# Patient Record
Sex: Male | Born: 1940 | Race: White | Hispanic: No | Marital: Married | Smoking: Never smoker
Health system: Southern US, Community
[De-identification: ages and names within clinical notes are randomized; demographics above are authoritative.]

## PROBLEM LIST (undated history)

## (undated) DIAGNOSIS — E119 Type 2 diabetes mellitus without complications: Secondary | ICD-10-CM

## (undated) DIAGNOSIS — G4733 Obstructive sleep apnea (adult) (pediatric): Secondary | ICD-10-CM

## (undated) DIAGNOSIS — M1990 Unspecified osteoarthritis, unspecified site: Secondary | ICD-10-CM

## (undated) DIAGNOSIS — N529 Male erectile dysfunction, unspecified: Secondary | ICD-10-CM

## (undated) DIAGNOSIS — K4090 Unilateral inguinal hernia, without obstruction or gangrene, not specified as recurrent: Secondary | ICD-10-CM

## (undated) DIAGNOSIS — I480 Paroxysmal atrial fibrillation: Secondary | ICD-10-CM

## (undated) DIAGNOSIS — I499 Cardiac arrhythmia, unspecified: Secondary | ICD-10-CM

## (undated) DIAGNOSIS — Z8489 Family history of other specified conditions: Secondary | ICD-10-CM

## (undated) DIAGNOSIS — Z87442 Personal history of urinary calculi: Secondary | ICD-10-CM

## (undated) DIAGNOSIS — R600 Localized edema: Secondary | ICD-10-CM

## (undated) DIAGNOSIS — F32A Depression, unspecified: Secondary | ICD-10-CM

## (undated) DIAGNOSIS — G4730 Sleep apnea, unspecified: Secondary | ICD-10-CM

## (undated) DIAGNOSIS — E785 Hyperlipidemia, unspecified: Secondary | ICD-10-CM

## (undated) DIAGNOSIS — K429 Umbilical hernia without obstruction or gangrene: Secondary | ICD-10-CM

## (undated) DIAGNOSIS — I5189 Other ill-defined heart diseases: Secondary | ICD-10-CM

## (undated) DIAGNOSIS — J45909 Unspecified asthma, uncomplicated: Secondary | ICD-10-CM

## (undated) DIAGNOSIS — I2510 Atherosclerotic heart disease of native coronary artery without angina pectoris: Secondary | ICD-10-CM

## (undated) DIAGNOSIS — N183 Chronic kidney disease, stage 3 unspecified: Secondary | ICD-10-CM

## (undated) DIAGNOSIS — N400 Enlarged prostate without lower urinary tract symptoms: Secondary | ICD-10-CM

## (undated) DIAGNOSIS — F329 Major depressive disorder, single episode, unspecified: Secondary | ICD-10-CM

## (undated) DIAGNOSIS — K219 Gastro-esophageal reflux disease without esophagitis: Secondary | ICD-10-CM

## (undated) DIAGNOSIS — M5416 Radiculopathy, lumbar region: Secondary | ICD-10-CM

## (undated) DIAGNOSIS — I493 Ventricular premature depolarization: Secondary | ICD-10-CM

## (undated) DIAGNOSIS — I519 Heart disease, unspecified: Secondary | ICD-10-CM

## (undated) DIAGNOSIS — I10 Essential (primary) hypertension: Secondary | ICD-10-CM

## (undated) DIAGNOSIS — R413 Other amnesia: Secondary | ICD-10-CM

## (undated) DIAGNOSIS — I4891 Unspecified atrial fibrillation: Secondary | ICD-10-CM

## (undated) DIAGNOSIS — I700 Atherosclerosis of aorta: Secondary | ICD-10-CM

## (undated) HISTORY — PX: CHOLECYSTECTOMY: SHX55

## (undated) HISTORY — DX: Heart disease, unspecified: I51.9

## (undated) HISTORY — PX: CYST REMOVAL LEG: SHX6280

## (undated) HISTORY — DX: Depression, unspecified: F32.A

## (undated) HISTORY — DX: Unspecified osteoarthritis, unspecified site: M19.90

## (undated) HISTORY — PX: BACK SURGERY: SHX140

## (undated) HISTORY — PX: HERNIA REPAIR: SHX51

## (undated) HISTORY — DX: Essential (primary) hypertension: I10

## (undated) HISTORY — DX: Unspecified atrial fibrillation: I48.91

## (undated) HISTORY — DX: Sleep apnea, unspecified: G47.30

## (undated) HISTORY — PX: CYSTOSCOPY W/ URETEROSCOPY W/ LITHOTRIPSY: SUR380

## (undated) HISTORY — DX: Type 2 diabetes mellitus without complications: E11.9

## (undated) HISTORY — DX: Major depressive disorder, single episode, unspecified: F32.9

## (undated) HISTORY — PX: KIDNEY STONE SURGERY: SHX686

## (undated) HISTORY — PX: PROSTATE BIOPSY: SHX241

## (undated) HISTORY — PX: TONSILLECTOMY: SUR1361

## (undated) HISTORY — PX: TOTAL HIP ARTHROPLASTY: SHX124

## (undated) HISTORY — PX: ROTATOR CUFF REPAIR: SHX139

## (undated) HISTORY — DX: Hyperlipidemia, unspecified: E78.5

## (undated) HISTORY — DX: Cardiac arrhythmia, unspecified: I49.9

## (undated) HISTORY — PX: NASAL SEPTUM SURGERY: SHX37

## (undated) HISTORY — PX: CORONARY ARTERY BYPASS GRAFT: SHX141

## (undated) HISTORY — PX: CARPAL TUNNEL RELEASE: SHX101

---

## 2012-09-12 DIAGNOSIS — Z951 Presence of aortocoronary bypass graft: Secondary | ICD-10-CM

## 2012-09-12 HISTORY — PX: CORONARY ARTERY BYPASS GRAFT: SHX141

## 2012-09-12 HISTORY — DX: Presence of aortocoronary bypass graft: Z95.1

## 2018-04-03 DIAGNOSIS — Z96649 Presence of unspecified artificial hip joint: Secondary | ICD-10-CM | POA: Insufficient documentation

## 2018-04-04 DIAGNOSIS — M1611 Unilateral primary osteoarthritis, right hip: Secondary | ICD-10-CM | POA: Insufficient documentation

## 2018-06-14 DIAGNOSIS — N183 Chronic kidney disease, stage 3 unspecified: Secondary | ICD-10-CM | POA: Insufficient documentation

## 2018-06-14 DIAGNOSIS — I10 Essential (primary) hypertension: Secondary | ICD-10-CM | POA: Insufficient documentation

## 2018-06-14 DIAGNOSIS — I209 Angina pectoris, unspecified: Secondary | ICD-10-CM | POA: Insufficient documentation

## 2018-06-14 DIAGNOSIS — G4733 Obstructive sleep apnea (adult) (pediatric): Secondary | ICD-10-CM | POA: Insufficient documentation

## 2018-06-14 DIAGNOSIS — E785 Hyperlipidemia, unspecified: Secondary | ICD-10-CM | POA: Insufficient documentation

## 2018-06-14 DIAGNOSIS — I340 Nonrheumatic mitral (valve) insufficiency: Secondary | ICD-10-CM | POA: Insufficient documentation

## 2018-06-14 DIAGNOSIS — E669 Obesity, unspecified: Secondary | ICD-10-CM | POA: Insufficient documentation

## 2018-06-14 DIAGNOSIS — I493 Ventricular premature depolarization: Secondary | ICD-10-CM | POA: Insufficient documentation

## 2018-06-14 DIAGNOSIS — N401 Enlarged prostate with lower urinary tract symptoms: Secondary | ICD-10-CM | POA: Insufficient documentation

## 2018-06-14 DIAGNOSIS — I2510 Atherosclerotic heart disease of native coronary artery without angina pectoris: Secondary | ICD-10-CM | POA: Insufficient documentation

## 2018-06-14 DIAGNOSIS — I4891 Unspecified atrial fibrillation: Secondary | ICD-10-CM | POA: Insufficient documentation

## 2018-06-14 DIAGNOSIS — R5383 Other fatigue: Secondary | ICD-10-CM | POA: Insufficient documentation

## 2018-06-14 DIAGNOSIS — E119 Type 2 diabetes mellitus without complications: Secondary | ICD-10-CM | POA: Insufficient documentation

## 2018-08-07 DIAGNOSIS — Z471 Aftercare following joint replacement surgery: Secondary | ICD-10-CM | POA: Insufficient documentation

## 2018-08-07 DIAGNOSIS — Z96641 Presence of right artificial hip joint: Secondary | ICD-10-CM | POA: Insufficient documentation

## 2018-09-21 ENCOUNTER — Encounter: Payer: Self-pay | Admitting: Urology

## 2018-09-21 ENCOUNTER — Ambulatory Visit: Payer: BLUE CROSS/BLUE SHIELD | Admitting: Urology

## 2018-09-21 ENCOUNTER — Other Ambulatory Visit: Payer: Self-pay

## 2018-09-21 VITALS — BP 112/53 | HR 74 | Ht 68.0 in | Wt 209.7 lb

## 2018-09-21 DIAGNOSIS — N5201 Erectile dysfunction due to arterial insufficiency: Secondary | ICD-10-CM | POA: Diagnosis not present

## 2018-09-21 DIAGNOSIS — R972 Elevated prostate specific antigen [PSA]: Secondary | ICD-10-CM | POA: Diagnosis not present

## 2018-09-21 DIAGNOSIS — N401 Enlarged prostate with lower urinary tract symptoms: Secondary | ICD-10-CM | POA: Diagnosis not present

## 2018-09-21 MED ORDER — SILDENAFIL CITRATE 20 MG PO TABS: ORAL_TABLET | ORAL | 0 refills | Status: DC

## 2018-09-21 NOTE — Progress Notes (Signed)
09/21/2018 1:59 PM   Jake Hudson 09-25-1940 [id number]  Referring provider: [name], MD [address] [place], [place] [address]  Chief Complaint  Patient presents with  . Establish Care    HPI: 78 year old male seen at the request of Dr. Maryjane Hurter for evaluation of an elevated PSA.  A PSA drawn December 2018 was 5.35.  He is new to the area and thinks his PCP in [place] had checked his PSA on occasions.  He is not sure of the value.  He has a long history of BPH.  He is currently on tamsulosin and taking a herbal supplement.  Voiding symptoms consist of urinary frequency, urgency, nocturia x5, intermittent urinary stream.  IPSS completed today was 28/4.  Denies dysuria, gross hematuria or flank/abdominal/pelvic/scrotal pain.  Past urologic history remarkable for ureteroscopic stone removal in [place] several years ago.  He also has a >1-year history of difficulty achieving and maintaining an erection.  He has had no previous treatment.  Organic risk factors include coronary artery disease, hypertension, diabetes, antihypertensive medications including a beta-blocker.  Denies use of oral or sublingual nitrates.    PMH: Past Medical History:  Diagnosis Date  . Arrhythmia   . Arthritis   . Atrial fibrillation (HCC)   . Depression   . Diabetes mellitus without complication (HCC)   . Heart disease   . Hyperlipidemia   . Hypertension   . Sleep apnea     Surgical History: Past Surgical History:  Procedure Laterality Date  . CARPAL TUNNEL RELEASE    . CHOLECYSTECTOMY    . CYST REMOVAL LEG    . HERNIA REPAIR    . KIDNEY STONE SURGERY    . NASAL SEPTUM SURGERY    . ROTATOR CUFF REPAIR    . TONSILLECTOMY    . TOTAL HIP ARTHROPLASTY      Home Medications:  Allergies as of 09/21/2018      Reactions   Sulfa Antibiotics Itching      Medication List       Accurate as of September 21, 2018  1:59 PM. Always use your most recent med list.          BERBERINE COMPLEX PO Take 500 mg by mouth 2 (two) times daily.   gabapentin 100 MG capsule Commonly known as:  NEURONTIN TAKE 3 CAPSULES BY MOUTH THREE TIMES DAILY   glimepiride 4 MG tablet Commonly known as:  AMARYL Take by mouth.   hydrochlorothiazide 25 MG tablet Commonly known as:  HYDRODIURIL Take by mouth.   HYDROcodone-acetaminophen 5-325 MG tablet Commonly known as:  NORCO/VICODIN Take 1 tablet by mouth every 6 (six) hours as needed for moderate pain.   losartan 100 MG tablet Commonly known as:  COZAAR losartan 100 mg tablet  TAKE ONE TABLET BY MOUTH DAILY   metoprolol succinate 50 MG 24 hr tablet Commonly known as:  TOPROL-XL Take by mouth.   naproxen 500 MG tablet Commonly known as:  NAPROSYN Take 500 mg by mouth 2 (two) times daily with a meal.   PROSTATE HEALTH PO Take by mouth.   rosuvastatin 5 MG tablet Commonly known as:  CRESTOR Take by mouth.   tamsulosin 0.4 MG Caps capsule Commonly known as:  FLOMAX TAKE (1) CAPSULE BY MOUTH TWICE DAILY   traMADol 50 MG tablet Commonly known as:  ULTRAM Take by mouth.   TRULICITY 0.75 MG/0.5ML Sopn Generic drug:  Dulaglutide INJECT 0.75MG  (0.5ML) ONCE WEEKLY AS DIRECTED   TYLENOL EXTRA STRENGTH  PO Take by mouth as needed.   Vitamin D3 125 MCG (5000 UT) Tabs Take 125 tablets by mouth daily.       Allergies:  Allergies  Allergen Reactions  . Sulfa Antibiotics Itching    Family History: No family history on file.  Social History:  has no history on file for tobacco, alcohol, and drug.  ROS: UROLOGY Frequent Urination?: Yes Hard to postpone urination?: Yes Burning/pain with urination?: No Get up at night to urinate?: Yes Leakage of urine?: No Urine stream starts and stops?: Yes Trouble starting stream?: No Do you have to strain to urinate?: No Blood in urine?: No Urinary tract infection?: No Sexually transmitted disease?: No Injury to kidneys or bladder?: No Painful intercourse?:  No Weak stream?: No Erection problems?: Yes Penile pain?: No  Gastrointestinal Nausea?: No Vomiting?: No Indigestion/heartburn?: No Diarrhea?: No Constipation?: No  Constitutional Fever: No Night sweats?: No Weight loss?: No Fatigue?: Yes  Skin Skin rash/lesions?: No Itching?: No  Eyes Blurred vision?: Yes Double vision?: No  Ears/Nose/Throat Sore throat?: No Sinus problems?: No  Hematologic/Lymphatic Swollen glands?: No Easy bruising?: No  Cardiovascular Leg swelling?: No Chest pain?: No  Respiratory Cough?: No Shortness of breath?: No  Endocrine Excessive thirst?: No  Musculoskeletal Back pain?: Yes Joint pain?: Yes  Neurological Headaches?: No Dizziness?: No  Psychologic Depression?: No Anxiety?: No  Physical Exam: BP (!) 112/53 (BP Location: Left Arm, Patient Position: Sitting, Cuff Size: Normal)   Pulse 74   Ht 5\' 8"  (1 .727 m)   Wt 209 lb 11.2 oz (95.1 kg)   BMI 31.88 kg/m   Constitutional:  Alert and oriented, No acute distress. HEENT: [place] AT, moist mucus membranes.  Trachea midline, no masses. Cardiovascular: No clubbing, cyanosis, or edema. Respiratory: Normal respiratory effort, no increased work of breathing. GI: Abdomen is soft, nontender, nondistended, no abdominal masses GU: No CVA tenderness.  Prostate 40 g, smooth without nodules Lymph: No cervical or inguinal lymphadenopathy. Skin: No rashes, bruises or suspicious lesions. Neurologic: Grossly intact, no focal deficits, moving all 4 extremities. Psychiatric: Normal mood and affect.   Assessment & Plan:   78 year old male with mild PSA elevation, BPH with lower urinary tract symptoms and erectile dysfunction.  We discussed AUA guidelines regarding detection of early prostate cancer and that prostate cancer screening is generally not recommended for patients older than age 78 due to the low risk of clinically significant prostate cancer.  His DRE is benign.  We also discussed  age-specific guidelines for PSA and that his PSA would be considered normal on this scale.  Management options were discussed including continued surveillance, prostate biopsy and adjunct of blood test including 4K.  MRIs typically are not covered by insurance for screening.  He has elected surveillance and will have him follow-up in 6 months for a repeat PSA/DRE.  He states his voiding symptoms are not bothersome enough that he desires any additional treatment or intervention.  Surgical options were discussed including TURP, PVP and UroLift.  He may consider these in the future.  He was interested in a trial of PDE 5 inhibitor for his ED and Rx generic sildenafil was sent to his pharmacy.    Riki AltesScott C Stoioff, MD  [facility] [address], [address] [place], [place] [address] [phone]

## 2018-09-26 ENCOUNTER — Other Ambulatory Visit: Payer: Self-pay

## 2018-10-19 ENCOUNTER — Ambulatory Visit: Payer: Self-pay | Admitting: Urology

## 2018-11-02 ENCOUNTER — Other Ambulatory Visit: Payer: Self-pay | Admitting: Urology

## 2018-11-02 MED ORDER — SILDENAFIL CITRATE 20 MG PO TABS: ORAL_TABLET | ORAL | 0 refills | Status: DC

## 2018-12-13 ENCOUNTER — Other Ambulatory Visit: Payer: Self-pay | Admitting: Urology

## 2018-12-13 MED ORDER — SILDENAFIL CITRATE 20 MG PO TABS: ORAL_TABLET | ORAL | 0 refills | Status: DC

## 2019-01-13 ENCOUNTER — Other Ambulatory Visit: Payer: Self-pay | Admitting: Urology

## 2019-01-14 MED ORDER — SILDENAFIL CITRATE 20 MG PO TABS: ORAL_TABLET | ORAL | 0 refills | Status: DC

## 2019-02-10 ENCOUNTER — Other Ambulatory Visit: Payer: Self-pay | Admitting: Urology

## 2019-02-11 MED ORDER — SILDENAFIL CITRATE 20 MG PO TABS: ORAL_TABLET | ORAL | 0 refills | Status: DC

## 2019-04-01 ENCOUNTER — Other Ambulatory Visit: Payer: Self-pay | Admitting: Urology

## 2019-04-01 MED ORDER — SILDENAFIL CITRATE 20 MG PO TABS: ORAL_TABLET | ORAL | 0 refills | Status: DC

## 2019-04-02 ENCOUNTER — Other Ambulatory Visit: Payer: BC Managed Care – PPO

## 2019-04-02 ENCOUNTER — Other Ambulatory Visit: Payer: Self-pay

## 2019-04-02 DIAGNOSIS — R972 Elevated prostate specific antigen [PSA]: Secondary | ICD-10-CM

## 2019-04-03 LAB — PSA: Prostate Specific Ag, Serum: 7.4 ng/mL — ABNORMAL HIGH (ref 0.0–4.0)

## 2019-04-05 ENCOUNTER — Other Ambulatory Visit: Payer: Self-pay

## 2019-04-05 ENCOUNTER — Ambulatory Visit: Payer: BC Managed Care – PPO | Admitting: Urology

## 2019-04-05 ENCOUNTER — Encounter: Payer: Self-pay | Admitting: Urology

## 2019-04-05 VITALS — BP 127/76 | HR 86 | Ht 68.0 in | Wt 212.7 lb

## 2019-04-05 DIAGNOSIS — N5201 Erectile dysfunction due to arterial insufficiency: Secondary | ICD-10-CM

## 2019-04-05 DIAGNOSIS — N401 Enlarged prostate with lower urinary tract symptoms: Secondary | ICD-10-CM | POA: Diagnosis not present

## 2019-04-05 DIAGNOSIS — R972 Elevated prostate specific antigen [PSA]: Secondary | ICD-10-CM

## 2019-04-05 DIAGNOSIS — I493 Ventricular premature depolarization: Secondary | ICD-10-CM | POA: Insufficient documentation

## 2019-04-05 DIAGNOSIS — N138 Other obstructive and reflux uropathy: Secondary | ICD-10-CM | POA: Diagnosis not present

## 2019-04-05 DIAGNOSIS — R9431 Abnormal electrocardiogram [ECG] [EKG]: Secondary | ICD-10-CM | POA: Insufficient documentation

## 2019-04-05 DIAGNOSIS — R9439 Abnormal result of other cardiovascular function study: Secondary | ICD-10-CM | POA: Insufficient documentation

## 2019-04-05 DIAGNOSIS — J90 Pleural effusion, not elsewhere classified: Secondary | ICD-10-CM | POA: Insufficient documentation

## 2019-04-05 MED ORDER — TADALAFIL 20 MG PO TABS: ORAL_TABLET | ORAL | 0 refills | Status: DC

## 2019-04-05 NOTE — Progress Notes (Signed)
04/05/2019 10:13 AM   Jake Hudson Feb 14, 1941 [id number]  Referring provider: [name], MD [address] [place],  [place] [address]  Chief Complaint  Patient presents with  . Elevated PSA    HPI: 78 year old male presents for follow-up of an elevated PSA.  He was initially referred by Dr. Maryjane Hurter in January 2020 for a PSA of 5.35.  No previous PSA reports were available however he did have a prior PSA done in [place] in March 2018 which was 2.7.  He also had a CT around that time which did show prostate enlargement.  He does have a lower urinary tract symptoms with a prior IPSS rated 28/4.  His voiding pattern is stable.  He is on tamsulosin.  Denies dysuria or gross hematuria.  Denies flank, abdominal, pelvic or scrotal pain.  A follow-up PSA drawn 04/02/2019 had increased to 7.4.   PMH: Past Medical History:  Diagnosis Date  . Arrhythmia   . Arthritis   . Atrial fibrillation (HCC)   . Depression   . Diabetes mellitus without complication (HCC)   . Heart disease   . Hyperlipidemia   . Hypertension   . Sleep apnea     Surgical History: Past Surgical History:  Procedure Laterality Date  . CARPAL TUNNEL RELEASE    . CHOLECYSTECTOMY    . CYST REMOVAL LEG    . HERNIA REPAIR    . KIDNEY STONE SURGERY    . NASAL SEPTUM SURGERY    . ROTATOR CUFF REPAIR    . TONSILLECTOMY    . TOTAL HIP ARTHROPLASTY      Home Medications:  Allergies as of 04/05/2019      Reactions   Rosuvastatin    Other reaction(s): Muscle Pain   Sulfa Antibiotics Itching      Medication List       Accurate as of April 05, 2019 10:13 AM. If you have any questions, ask your nurse or doctor.        STOP taking these medications   hydrochlorothiazide 25 MG tablet Commonly known as: HYDRODIURIL Stopped by: [name], MD   losartan 100 MG tablet Commonly known as: COZAAR Stopped by: [name], MD   naproxen 500 MG tablet Commonly known as: NAPROSYN  Stopped by: [name], MD   rosuvastatin 5 MG tablet Commonly known as: CRESTOR Stopped by: [name], MD   traMADol 50 MG tablet Commonly known as: ULTRAM Stopped by: [name], MD     TAKE these medications   BERBERINE COMPLEX PO Take 500 mg by mouth 2 (two) times daily.   ezetimibe 10 MG tablet Commonly known as: ZETIA Take by mouth.   gabapentin 100 MG capsule Commonly known as: NEURONTIN TAKE 3 CAPSULES BY MOUTH THREE TIMES DAILY   glimepiride 4 MG tablet Commonly known as: AMARYL Take by mouth.   glucose blood test strip USE 3 TIMES DAILY   HYDROcodone-acetaminophen 5-325 MG tablet Commonly known as: NORCO/VICODIN Take 1 tablet by mouth every 6 (six) hours as needed for moderate pain.   metoprolol succinate 25 MG 24 hr tablet Commonly known as: TOPROL-XL Take by mouth.   omeprazole 40 MG capsule Commonly known as: PRILOSEC omeprazole 40 mg capsule,delayed release   PROSTATE HEALTH PO Take by mouth.   sildenafil 20 MG tablet Commonly known as: REVATIO 2-5 tabs 1 hour prior to intercourse   tamsulosin 0.4 MG Caps capsule Commonly known as: FLOMAX TAKE (1) CAPSULE BY  MOUTH TWICE DAILY   Trulicity 9.56 OZ/3.0QM Sopn Generic drug: Dulaglutide INJECT 0.75MG  (0.5ML) ONCE WEEKLY AS DIRECTED   TYLENOL EXTRA STRENGTH PO Take by mouth as needed.   valsartan 160 MG tablet Commonly known as: DIOVAN valsartan 160 mg tablet   Vitamin D3 125 MCG (5000 UT) Tabs Take 125 tablets by mouth daily.       Allergies:  Allergies  Allergen Reactions  . Rosuvastatin     Other reaction(s): Muscle Pain  . Sulfa Antibiotics Itching    Family History: No family history on file.  Social History:  has no history on file for tobacco, alcohol, and drug.  ROS: UROLOGY Frequent Urination?: Yes Hard to postpone urination?: Yes Burning/pain with urination?: Yes Get up at night to urinate?: Yes Leakage of urine?: No Urine stream starts and  stops?: Yes Trouble starting stream?: No Do you have to strain to urinate?: No Blood in urine?: No Urinary tract infection?: No Sexually transmitted disease?: No Injury to kidneys or bladder?: No Painful intercourse?: No Weak stream?: Yes Erection problems?: Yes Penile pain?: No  Gastrointestinal Nausea?: No Vomiting?: No Indigestion/heartburn?: No Diarrhea?: No Constipation?: No  Constitutional Fever: No Night sweats?: No Weight loss?: No Fatigue?: Yes  Skin Skin rash/lesions?: No Itching?: No  Eyes Blurred vision?: No Double vision?: No  Ears/Nose/Throat Sore throat?: No Sinus problems?: No  Hematologic/Lymphatic Swollen glands?: No Easy bruising?: No  Cardiovascular Leg swelling?: No Chest pain?: No  Respiratory Cough?: No Shortness of breath?: No  Endocrine Excessive thirst?: No  Musculoskeletal Back pain?: No Joint pain?: Yes  Neurological Headaches?: No Dizziness?: No  Psychologic Depression?: No Anxiety?: No  Physical Exam: BP 127/76 (BP Location: Left Arm, Patient Position: Sitting, Cuff Size: Normal)   Pulse 86   Ht 5\' 8"  (1.727 m)   Wt 212 lb 11.2 oz (96.5 kg)   BMI 32.34 kg/m   Constitutional:  Alert and oriented, No acute distress. HEENT: [place] AT, moist mucus membranes.  Trachea midline, no masses. Cardiovascular: No clubbing, cyanosis, or edema. Respiratory: Normal respiratory effort, no increased work of breathing. GU: Prostate 50 g, smooth without nodules Skin: No rashes, bruises or suspicious lesions. Neurologic: Grossly intact, no focal deficits, moving all 4 extremities. Psychiatric: Normal mood and affect.   Assessment & Plan:   78 year old male with an elevated PSA which is rising.  Prior PSA in 2018 was normal.  We discussed potential etiologies including prostate cancer and BPH/inflammation.  I did not recommend continued surveillance with his increasing PSA.  We discussed standard prostate biopsy and prostate MRI.   4K score was also reviewed.  He has elected a prostate MRI and if any abnormal lesions noted subsequent fusion biopsy.  The false-negative rate of prostate MRIs were also discussed.  If MRI is approved by his insurance he will be notified with results and further recommendations.  He also has ED on sildenafil which he states has only been partially effective.  He desired a trial of generic tadalafil.   Abbie Sons, [place] [address], [place],  [address] [phone]

## 2019-12-11 ENCOUNTER — Encounter: Payer: Self-pay | Admitting: Urology

## 2019-12-17 NOTE — Telephone Encounter (Signed)
Pt left vm he states he left a message for Dr. Lonna Cobb on 12/11/19 and has not heard anything please call pt

## 2019-12-20 ENCOUNTER — Telehealth: Payer: Self-pay

## 2019-12-20 DIAGNOSIS — R972 Elevated prostate specific antigen [PSA]: Secondary | ICD-10-CM

## 2019-12-20 NOTE — Telephone Encounter (Signed)
He was on the strongest dose of tadalafil. Since the oral medications have not worked his next step would be either intracavernosal injections or a vacuum erection device.   I had recommended a prostate MRI for his elevated PSA. This was ordered and approved by insurance but was apparently not done. I will reorder the MRI. Since it has been several months since his last PSA would also recommend a lab visit for a repeat PSA   Patient notified and states he does not wish to try injections or the pump. His apt was made for a PSA draw and he was notified that radiology should be contacting him next week to schedule the MRI. Patient verbalized understanding

## 2019-12-21 ENCOUNTER — Other Ambulatory Visit: Payer: Self-pay | Admitting: Urology

## 2019-12-23 ENCOUNTER — Other Ambulatory Visit: Payer: Medicare Other

## 2019-12-23 ENCOUNTER — Other Ambulatory Visit: Payer: Self-pay

## 2019-12-23 DIAGNOSIS — R972 Elevated prostate specific antigen [PSA]: Secondary | ICD-10-CM

## 2019-12-24 LAB — PSA: Prostate Specific Ag, Serum: 5.9 ng/mL — ABNORMAL HIGH (ref 0.0–4.0)

## 2019-12-30 ENCOUNTER — Encounter: Payer: Self-pay | Admitting: Urology

## 2019-12-31 ENCOUNTER — Encounter: Payer: Self-pay | Admitting: Emergency Medicine

## 2019-12-31 ENCOUNTER — Ambulatory Visit
Admission: EM | Admit: 2019-12-31 | Discharge: 2019-12-31 | Disposition: A | Discharge status: Home / Self Care | Payer: Medicare Other | Attending: Family Medicine | Admitting: Family Medicine

## 2019-12-31 ENCOUNTER — Other Ambulatory Visit: Payer: Self-pay

## 2019-12-31 ENCOUNTER — Telehealth: Payer: Self-pay | Admitting: *Deleted

## 2019-12-31 DIAGNOSIS — Z23 Encounter for immunization: Secondary | ICD-10-CM | POA: Diagnosis not present

## 2019-12-31 DIAGNOSIS — T148XXA Other injury of unspecified body region, initial encounter: Secondary | ICD-10-CM | POA: Diagnosis not present

## 2019-12-31 MED ORDER — TETANUS-DIPHTH-ACELL PERTUSSIS 5-2.5-18.5 LF-MCG/0.5 IM SUSP
0.5000 mL | Freq: Once | INTRAMUSCULAR | Status: AC
  Administered 2019-12-31: 0.5 mL via INTRAMUSCULAR

## 2019-12-31 NOTE — Telephone Encounter (Signed)
-----   Message from [name], MD sent at 12/31/2019  1:18 PM EDT ----- Repeat PSA still elevated above baseline.  Recommend proceeding with MRI and will order.

## 2019-12-31 NOTE — Telephone Encounter (Signed)
Notified patient as instructed. Patient would like to get MRI .

## 2019-12-31 NOTE — ED Triage Notes (Signed)
Patient states he was working with a T-post and the hook went into his right thumb. He can't remember the last time he had a tetanus shot and needs to get one today.

## 2019-12-31 NOTE — ED Provider Notes (Signed)
[facility]    CSN: [id number] Arrival date & time: 12/31/19  4098  History   Chief Complaint Chief Complaint  Patient presents with  . Puncture Wound   HPI  79 year old male presents with a puncture wound.  Patient reports he was working with a T post yesterday afternoon.  States that he inadvertently suffered a puncture wound to his right thumb.  He has a small superficial wound.  No bleeding.  No surrounding erythema or drainage.  Patient is unsure of his last tetanus.  Requesting tetanus today.  Pain is mild currently.  No other associated symptoms.  No other complaints.  Past Medical History:  Diagnosis Date  . Arrhythmia   . Arthritis   . Atrial fibrillation (HCC)   . Depression   . Diabetes mellitus without complication (HCC)   . Heart disease   . Hyperlipidemia   . Hypertension   . Sleep apnea     Patient Active Problem List   Diagnosis Date Noted  . Cardiovascular stress test abnormal 04/05/2019  . Electrocardiogram abnormal 04/05/2019  . Pleural effusion 04/05/2019  . Ventricular premature beats 04/05/2019  . Elevated PSA 09/21/2018  . Erectile dysfunction due to arterial insufficiency 09/21/2018  . 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 08/07/2018  . Angina pectoris (HCC) 06/14/2018  . Atrial fibrillation (HCC) 06/14/2018  . Benign prostatic hyperplasia with lower urinary tract symptoms 06/14/2018  . CKD (chronic kidney disease) stage 3, GFR 30-59 ml/min 06/14/2018  . Coronary arteriosclerosis 06/14/2018  . DM (diabetes mellitus), type 2 (HCC) 06/14/2018  . Fatigue 06/14/2018  . Essential hypertension 06/14/2018  . Hyperlipidemia 06/14/2018  . Mitral valve regurgitation 06/14/2018  . Obesity (BMI 30-39.9) 06/14/2018  . Obstructive sleep apnea syndrome 06/14/2018  . Ventricular premature complex 06/14/2018  . 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 CARPAL TUNNEL RELEASE    . CHOLECYSTECTOMY    . CYST REMOVAL LEG    . HERNIA REPAIR    . KIDNEY STONE SURGERY    . NASAL SEPTUM SURGERY    . ROTATOR CUFF REPAIR    . TONSILLECTOMY    . TOTAL HIP ARTHROPLASTY         Home Medications    Prior to Admission medications   Medication Sig Start Date End Date Taking? Authorizing Provider  Acetaminophen (TYLENOL EXTRA STRENGTH PO) Take by mouth as needed.   Yes [provider]  Barberry-Oreg Grape-Goldenseal (BERBERINE COMPLEX PO) Take 500 mg by mouth 2 (two) times daily.   Yes [provider]  Cholecalciferol (VITAMIN D3) 125 MCG (5000 UT) TABS Take 125 tablets by mouth daily.   Yes [provider]  Dulaglutide (TRULICITY) 0.75 MG/0.5ML SOPN INJECT 0.75MG  (0.5ML) ONCE WEEKLY AS DIRECTED 09/03/18  Yes [provider]  ezetimibe (ZETIA) 10 MG tablet Take by mouth. 01/08/19 01/08/20 Yes [provider]  gabapentin (NEURONTIN) 100 MG capsule TAKE 3 CAPSULES BY MOUTH THREE TIMES DAILY 09/03/18  Yes [provider]  glimepiride (AMARYL) 4 MG tablet Take by mouth. 06/09/18  Yes [provider]  glucose blood test strip USE 3 TIMES DAILY 01/25/19  Yes [provider]  HYDROcodone-acetaminophen (NORCO/VICODIN) 5-325 MG tablet Take 1 tablet by mouth every 6 (six) hours as needed for moderate pain.   Yes [provider]  metoprolol succinate (TOPROL-XL) 25 MG 24 hr tablet Take by  mouth. 03/14/19  Yes [provider]  Misc Natural Products (PROSTATE HEALTH PO) Take by mouth.   Yes [provider]  omeprazole (PRILOSEC) 40 MG capsule omeprazole 40 mg capsule,delayed release   Yes [provider]  tadalafil (CIALIS) 20 MG tablet TAKE (1) TABLET BY MOUTH AS NEEDED 1 HOUR PRIOR TO INTERCOURSE 12/23/19  Yes Stoioff, Verna Czech, MD  tamsulosin (FLOMAX) 0.4 MG CAPS capsule TAKE (1) CAPSULE BY MOUTH TWICE  DAILY 09/03/18  Yes [provider]  valsartan (DIOVAN) 160 MG tablet valsartan 160 mg tablet   Yes [provider]   Social History Social History   Tobacco Use  . Smoking status: Never Smoker  . Smokeless tobacco: Never Used  Substance Use Topics  . Alcohol use: Not Currently  . Drug use: Never     Allergies   Rosuvastatin and Sulfa antibiotics   Review of Systems Review of Systems  Constitutional: Negative.   Skin: Positive for wound.   Physical Exam Triage Vital Signs ED Triage Vitals  Enc Vitals Group     BP 12/31/19 0902 (!) 166/85     Pulse Rate 12/31/19 0902 70     Resp 12/31/19 0902 18     Temp 12/31/19 0902 98.4 F (36.9 C)     Temp Source 12/31/19 0902 Oral     SpO2 12/31/19 0902 98 %     Weight 12/31/19 0901 207 lb (93.9 kg)     Height 12/31/19 0901 5\' 8"  (1.727 m)     Head Circumference --      Peak Flow --      Pain Score 12/31/19 0900 4     Pain Loc --      Pain Edu? --      Excl. in GC? --    Updated Vital Signs BP (!) 166/85 (BP Location: Right Arm)   Pulse 70   Temp 98.4 F (36.9 C) (Oral)   Resp 18   Ht 5\' 8"  (1.727 m)   Wt 93.9 kg   SpO2 98%   BMI 31.47 kg/m   Visual Acuity Right Eye Distance:   Left Eye Distance:   Bilateral Distance:    Right Eye Near:   Left Eye Near:    Bilateral Near:     Physical Exam Constitutional:      General: He is not in acute distress.    Appearance: Normal appearance. He is not ill-appearing.  HENT:     Head: Normocephalic and atraumatic.  Eyes:     General:        Right eye: No discharge.        Left eye: No discharge.     Conjunctiva/sclera: Conjunctivae normal.  Cardiovascular:     Rate and Rhythm: Normal rate and regular rhythm.  Pulmonary:     Effort: Pulmonary effort is normal.     Breath sounds: Normal breath sounds. No wheezing, rhonchi or rales.  Skin:    Comments: Right thumb -small superficial wound noted.  Neurological:     Mental Status: He is alert.   Psychiatric:        Mood and Affect: Mood normal.        Behavior: Behavior normal.    UC Treatments / Results  Labs (all labs ordered are listed, but only abnormal results are displayed) Labs Reviewed - No data to display  EKG   Radiology No results found.  Procedures Procedures (including critical care time)  Medications Ordered in UC Medications  Tdap (BOOSTRIX) injection 0.5 mL (0.5 mLs Intramuscular Given 12/31/19 0910)    Initial Impression / Assessment and Plan / UC Course  I have reviewed the triage vital signs and the nursing notes.  Pertinent labs & imaging results that were available during my care of the patient were reviewed by me and considered in my medical decision making (see chart for details).    79 year old male presents with a puncture wound.  Tetanus given today.  Supportive care.  Final Clinical Impressions(s) / UC Diagnoses   Final diagnoses:  Puncture wound     Discharge Instructions     Keep area clean.  Call if you develop signs of infection.  Take care  Dr. Lacinda Axon     ED Prescriptions    None     PDMP not reviewed this encounter.   Coral Spikes, [place] 12/31/19 1130

## 2019-12-31 NOTE — Discharge Instructions (Signed)
Keep area clean.  Call if you develop signs of infection.  Take care  Dr. Adriana Simas

## 2020-01-01 ENCOUNTER — Other Ambulatory Visit: Payer: Self-pay | Admitting: Urology

## 2020-01-01 DIAGNOSIS — R972 Elevated prostate specific antigen [PSA]: Secondary | ICD-10-CM

## 2020-01-21 ENCOUNTER — Encounter: Payer: Self-pay | Admitting: Urology

## 2020-01-23 ENCOUNTER — Other Ambulatory Visit: Payer: Self-pay | Admitting: Urology

## 2020-01-23 DIAGNOSIS — R972 Elevated prostate specific antigen [PSA]: Secondary | ICD-10-CM

## 2020-01-24 ENCOUNTER — Ambulatory Visit: Payer: Medicare Other

## 2020-02-05 ENCOUNTER — Ambulatory Visit (HOSPITAL_COMMUNITY)
Admission: RE | Admit: 2020-02-05 | Discharge: 2020-02-05 | Disposition: A | Discharge status: Home / Self Care | Payer: Medicare Other | Source: Ambulatory Visit | Attending: Urology | Admitting: Urology

## 2020-02-05 ENCOUNTER — Other Ambulatory Visit: Payer: Self-pay

## 2020-02-05 ENCOUNTER — Encounter (HOSPITAL_COMMUNITY): Payer: Self-pay

## 2020-02-05 DIAGNOSIS — R972 Elevated prostate specific antigen [PSA]: Secondary | ICD-10-CM

## 2020-02-05 MED ORDER — GADOBUTROL 1 MMOL/ML IV SOLN: 10.0000 mL | Freq: Once | INTRAVENOUS | Status: DC | PRN

## 2020-04-06 ENCOUNTER — Other Ambulatory Visit: Payer: Self-pay | Admitting: Urology

## 2020-04-09 ENCOUNTER — Other Ambulatory Visit: Payer: Self-pay

## 2020-04-09 ENCOUNTER — Ambulatory Visit (INDEPENDENT_AMBULATORY_CARE_PROVIDER_SITE_OTHER): Payer: Medicare Other | Admitting: Urology

## 2020-04-09 ENCOUNTER — Encounter: Payer: Self-pay | Admitting: Urology

## 2020-04-09 VITALS — BP 167/81 | HR 73 | Ht 68.0 in | Wt 210.0 lb

## 2020-04-09 DIAGNOSIS — R972 Elevated prostate specific antigen [PSA]: Secondary | ICD-10-CM | POA: Diagnosis not present

## 2020-04-09 DIAGNOSIS — N5201 Erectile dysfunction due to arterial insufficiency: Secondary | ICD-10-CM | POA: Diagnosis not present

## 2020-04-09 NOTE — Progress Notes (Signed)
04/09/2020 1:29 PM   Jake Hudson 1941/05/24 [id number]  Referring provider: [name], MD [address] [place],  [place] [address]  Chief Complaint  Patient presents with   Medication Refill    HPI: 79 y.o. male presents for follow-up of an elevated PSA   Initially seen 09/2018 for a PSA of 5.35 drawn 08/2017  Repeat PSA July 2020 increased to 7.4  Prostate MRI was recommended however was apparently never scheduled  MRI was again ordered and scheduled however had to be canceled due to history of bilateral hip replacements and was rescheduled to [facility] long  The MRI at [facility] apparently required an endorectal probe which the patient was not aware and was unable to tolerate  Repeat PSA 12/23/2019 better at 5.9   PMH: Past Medical History:  Diagnosis Date   Arrhythmia    Arthritis    Atrial fibrillation (HCC)    Depression    Diabetes mellitus without complication (HCC)    Heart disease    Hyperlipidemia    Hypertension    Sleep apnea     Surgical History: Past Surgical History:  Procedure Laterality Date   CARPAL TUNNEL RELEASE     CHOLECYSTECTOMY     CYST REMOVAL LEG     HERNIA REPAIR     KIDNEY STONE SURGERY     NASAL SEPTUM SURGERY     ROTATOR CUFF REPAIR     TONSILLECTOMY     TOTAL HIP ARTHROPLASTY      Home Medications:  Allergies as of 04/09/2020      Reactions   Rosuvastatin    Other reaction(s): Muscle Pain   Sulfa Antibiotics Itching      Medication List       Accurate as of April 09, 2020  1:29 PM. If you have any questions, ask your nurse or doctor.        BERBERINE COMPLEX PO Take 500 mg by mouth 2 (two) times daily.   ezetimibe 10 MG tablet Commonly known as: ZETIA Take by mouth.   gabapentin 100 MG capsule Commonly known as: NEURONTIN TAKE 3 CAPSULES BY MOUTH THREE TIMES DAILY   glimepiride 4 MG tablet Commonly known as: AMARYL Take by mouth.   glucose blood test  strip USE 3 TIMES DAILY   HYDROcodone-acetaminophen 5-325 MG tablet Commonly known as: NORCO/VICODIN Take 1 tablet by mouth every 6 (six) hours as needed for moderate pain.   metoprolol succinate 25 MG 24 hr tablet Commonly known as: TOPROL-XL Take by mouth.   omeprazole 40 MG capsule Commonly known as: PRILOSEC omeprazole 40 mg capsule,delayed release   PROSTATE HEALTH PO Take by mouth.   tadalafil 20 MG tablet Commonly known as: CIALIS TAKE (1) TABLET BY MOUTH AS NEEDED 1 HOUR PRIOR TO INTERCOURSE   tamsulosin 0.4 MG Caps capsule Commonly known as: FLOMAX TAKE (1) CAPSULE BY MOUTH TWICE DAILY   Trulicity 0.75 MG/0.5ML Sopn Generic drug: Dulaglutide INJECT 0.75MG  (0.5ML) ONCE WEEKLY AS DIRECTED   TYLENOL EXTRA STRENGTH PO Take by mouth as needed.   valsartan 160 MG tablet Commonly known as: DIOVAN valsartan 160 mg tablet   Vitamin D3 125 MCG (5000 UT) Tabs Take 125 tablets by mouth daily.       Allergies:  Allergies  Allergen Reactions   Rosuvastatin     Other reaction(s): Muscle Pain   Sulfa Antibiotics Itching    Family History: No family history on file.  Social History:  reports that he has  never smoked. He has never used smokeless tobacco. He reports previous alcohol use. He reports that he does not use drugs.   Physical Exam: BP (!) 167/81    Pulse 73    Ht 5\' 8"  (1.727 m)    Wt (!) 210 lb (95.3 kg)    BMI 31.93 kg/m   Constitutional:  Alert and oriented, No acute distress. HEENT: [place] AT, moist mucus membranes.  Trachea midline, no masses. Cardiovascular: No clubbing, cyanosis, or edema. Respiratory: Normal respiratory effort, no increased work of breathing. GU: Prostate 60 g, smooth without nodules Skin: No rashes, bruises or suspicious lesions. Neurologic: Grossly intact, no focal deficits, moving all 4 extremities. Psychiatric: Normal mood and affect.   Assessment & Plan:    1.  Elevated PSA  With his PSA lower he has elected to  continue surveillance and will follow up in October 2021 for a repeat PSA versus 4K score if covered by insurance  2.  Erectile dysfunction  Tadalafil has been effective and he has requested a refill  November 2021, MD  [facility] [address], [address] [place], [place] [phone]

## 2020-04-10 ENCOUNTER — Encounter: Payer: Self-pay | Admitting: Urology

## 2020-04-12 ENCOUNTER — Other Ambulatory Visit: Payer: Self-pay | Admitting: Urology

## 2020-04-12 MED ORDER — TADALAFIL 20 MG PO TABS: ORAL_TABLET | ORAL | 3 refills | Status: DC

## 2020-06-26 ENCOUNTER — Encounter: Payer: Self-pay | Admitting: Urology

## 2020-07-06 ENCOUNTER — Other Ambulatory Visit: Payer: Self-pay

## 2020-07-06 ENCOUNTER — Other Ambulatory Visit
Admission: RE | Admit: 2020-07-06 | Discharge: 2020-07-06 | Disposition: A | Discharge status: Home / Self Care | Payer: Medicare Other | Source: Ambulatory Visit | Attending: Cardiology | Admitting: Cardiology

## 2020-07-06 DIAGNOSIS — Z01812 Encounter for preprocedural laboratory examination: Secondary | ICD-10-CM | POA: Diagnosis present

## 2020-07-06 DIAGNOSIS — Z20822 Contact with and (suspected) exposure to covid-19: Secondary | ICD-10-CM | POA: Diagnosis not present

## 2020-07-06 LAB — SARS CORONAVIRUS 2 (TAT 6-24 HRS): SARS Coronavirus 2: NEGATIVE

## 2020-07-08 ENCOUNTER — Other Ambulatory Visit: Payer: Self-pay

## 2020-07-08 ENCOUNTER — Encounter
Admission: RE | Disposition: A | Discharge status: Home / Self Care | Payer: Self-pay | Source: Home / Self Care | Attending: Cardiology

## 2020-07-08 ENCOUNTER — Encounter: Payer: Self-pay | Admitting: Cardiology

## 2020-07-08 ENCOUNTER — Ambulatory Visit
Admission: RE | Admit: 2020-07-08 | Discharge: 2020-07-08 | Disposition: A | Discharge status: Home / Self Care | Payer: Medicare Other | Attending: Cardiology | Admitting: Cardiology

## 2020-07-08 DIAGNOSIS — Z7984 Long term (current) use of oral hypoglycemic drugs: Secondary | ICD-10-CM | POA: Insufficient documentation

## 2020-07-08 DIAGNOSIS — Z7982 Long term (current) use of aspirin: Secondary | ICD-10-CM | POA: Insufficient documentation

## 2020-07-08 DIAGNOSIS — Z888 Allergy status to other drugs, medicaments and biological substances status: Secondary | ICD-10-CM | POA: Diagnosis not present

## 2020-07-08 DIAGNOSIS — I340 Nonrheumatic mitral (valve) insufficiency: Secondary | ICD-10-CM | POA: Insufficient documentation

## 2020-07-08 DIAGNOSIS — E119 Type 2 diabetes mellitus without complications: Secondary | ICD-10-CM | POA: Insufficient documentation

## 2020-07-08 DIAGNOSIS — I25810 Atherosclerosis of coronary artery bypass graft(s) without angina pectoris: Secondary | ICD-10-CM | POA: Diagnosis not present

## 2020-07-08 DIAGNOSIS — G4733 Obstructive sleep apnea (adult) (pediatric): Secondary | ICD-10-CM | POA: Diagnosis not present

## 2020-07-08 DIAGNOSIS — E782 Mixed hyperlipidemia: Secondary | ICD-10-CM | POA: Diagnosis not present

## 2020-07-08 DIAGNOSIS — I4891 Unspecified atrial fibrillation: Secondary | ICD-10-CM | POA: Diagnosis not present

## 2020-07-08 DIAGNOSIS — I10 Essential (primary) hypertension: Secondary | ICD-10-CM | POA: Insufficient documentation

## 2020-07-08 DIAGNOSIS — Z79899 Other long term (current) drug therapy: Secondary | ICD-10-CM | POA: Diagnosis not present

## 2020-07-08 DIAGNOSIS — Z882 Allergy status to sulfonamides status: Secondary | ICD-10-CM | POA: Diagnosis not present

## 2020-07-08 DIAGNOSIS — I2510 Atherosclerotic heart disease of native coronary artery without angina pectoris: Secondary | ICD-10-CM | POA: Diagnosis not present

## 2020-07-08 DIAGNOSIS — R9439 Abnormal result of other cardiovascular function study: Secondary | ICD-10-CM

## 2020-07-08 DIAGNOSIS — Z8249 Family history of ischemic heart disease and other diseases of the circulatory system: Secondary | ICD-10-CM | POA: Insufficient documentation

## 2020-07-08 DIAGNOSIS — Z96641 Presence of right artificial hip joint: Secondary | ICD-10-CM | POA: Insufficient documentation

## 2020-07-08 HISTORY — PX: LEFT HEART CATH AND CORONARY ANGIOGRAPHY: CATH118249

## 2020-07-08 LAB — GLUCOSE, CAPILLARY: Glucose-Capillary: 161 mg/dL — ABNORMAL HIGH (ref 70–99)

## 2020-07-08 SURGERY — LEFT HEART CATH AND CORONARY ANGIOGRAPHY
Anesthesia: Moderate Sedation | Laterality: Left

## 2020-07-08 MED ORDER — LIDOCAINE HCL (PF) 1 % IJ SOLN
INTRAMUSCULAR | Status: AC
  Filled 2020-07-08: qty 30

## 2020-07-08 MED ORDER — SODIUM CHLORIDE 0.9% FLUSH: 3.0000 mL | INTRAVENOUS | Status: DC | PRN

## 2020-07-08 MED ORDER — HYDRALAZINE HCL 20 MG/ML IJ SOLN: 10.0000 mg | INTRAMUSCULAR | Status: DC | PRN

## 2020-07-08 MED ORDER — SODIUM CHLORIDE 0.9 % IV SOLN: 250.0000 mL | INTRAVENOUS | Status: DC | PRN

## 2020-07-08 MED ORDER — ASPIRIN 81 MG PO CHEW
81.0000 mg | CHEWABLE_TABLET | ORAL | Status: AC
  Administered 2020-07-08: 81 mg via ORAL

## 2020-07-08 MED ORDER — HEPARIN (PORCINE) IN NACL 1000-0.9 UT/500ML-% IV SOLN
INTRAVENOUS | Status: AC
  Filled 2020-07-08: qty 1000

## 2020-07-08 MED ORDER — SODIUM CHLORIDE 0.9 % IV SOLN: INTRAVENOUS | Status: DC

## 2020-07-08 MED ORDER — FENTANYL CITRATE (PF) 100 MCG/2ML IJ SOLN
INTRAMUSCULAR | Status: DC | PRN
  Administered 2020-07-08: 25 ug via INTRAVENOUS

## 2020-07-08 MED ORDER — IOHEXOL 300 MG/ML  SOLN
INTRAMUSCULAR | Status: DC | PRN
  Administered 2020-07-08: 115 mL

## 2020-07-08 MED ORDER — LABETALOL HCL 5 MG/ML IV SOLN: 10.0000 mg | INTRAVENOUS | Status: DC | PRN

## 2020-07-08 MED ORDER — MIDAZOLAM HCL 2 MG/2ML IJ SOLN
INTRAMUSCULAR | Status: DC | PRN
  Administered 2020-07-08: 1 mg via INTRAVENOUS

## 2020-07-08 MED ORDER — SODIUM CHLORIDE 0.9% FLUSH: 3.0000 mL | Freq: Two times a day (BID) | INTRAVENOUS | Status: DC

## 2020-07-08 MED ORDER — ASPIRIN 81 MG PO CHEW
CHEWABLE_TABLET | ORAL | Status: AC
  Filled 2020-07-08: qty 1

## 2020-07-08 MED ORDER — SODIUM CHLORIDE 0.9 % WEIGHT BASED INFUSION: 1.0000 mL/kg/h | INTRAVENOUS | Status: DC

## 2020-07-08 MED ORDER — HEPARIN (PORCINE) IN NACL 2000-0.9 UNIT/L-% IV SOLN
INTRAVENOUS | Status: DC | PRN
  Administered 2020-07-08: 1000 mL

## 2020-07-08 MED ORDER — LIDOCAINE HCL (PF) 1 % IJ SOLN
INTRAMUSCULAR | Status: DC | PRN
  Administered 2020-07-08: 2 mL

## 2020-07-08 MED ORDER — MIDAZOLAM HCL 2 MG/2ML IJ SOLN
INTRAMUSCULAR | Status: AC
  Filled 2020-07-08: qty 2

## 2020-07-08 MED ORDER — ONDANSETRON HCL 4 MG/2ML IJ SOLN: 4.0000 mg | Freq: Four times a day (QID) | INTRAMUSCULAR | Status: DC | PRN

## 2020-07-08 MED ORDER — SODIUM CHLORIDE 0.9 % IV SOLN: Freq: Once | INTRAVENOUS | Status: AC

## 2020-07-08 MED ORDER — ACETAMINOPHEN 325 MG PO TABS: 650.0000 mg | ORAL_TABLET | ORAL | Status: DC | PRN

## 2020-07-08 MED ORDER — FENTANYL CITRATE (PF) 100 MCG/2ML IJ SOLN
INTRAMUSCULAR | Status: AC
  Filled 2020-07-08: qty 2

## 2020-07-08 SURGICAL SUPPLY — 15 items
CATH INFINITI 5 FR IM (CATHETERS) ×3 IMPLANT
CATH INFINITI 5 FR LCB (CATHETERS) ×3 IMPLANT
CATH INFINITI 5FR ANG PIGTAIL (CATHETERS) ×3 IMPLANT
CATH INFINITI 5FR JL4 (CATHETERS) ×3 IMPLANT
CATH INFINITI JR4 5F (CATHETERS) ×3 IMPLANT
DEVICE CLOSURE MYNXGRIP 5F (Vascular Products) ×3 IMPLANT
KIT SYRINGE INJ CVI SPIKEX1 (MISCELLANEOUS) ×3 IMPLANT
NEEDLE PERC 18GX7CM (NEEDLE) ×3 IMPLANT
PACK CARDIAC CATH (CUSTOM PROCEDURE TRAY) ×3 IMPLANT
PROTECTION STATION PRESSURIZED (MISCELLANEOUS) ×3
SET ATX SIMPLICITY (MISCELLANEOUS) ×3 IMPLANT
SHEATH AVANTI 5FR X 11CM (SHEATH) ×3 IMPLANT
STATION PROTECTION PRESSURIZED (MISCELLANEOUS) ×1 IMPLANT
WIRE EMERALD 3MM-J .035X260CM (WIRE) ×3 IMPLANT
WIRE GUIDERIGHT .035X150 (WIRE) ×3 IMPLANT

## 2020-07-09 ENCOUNTER — Other Ambulatory Visit: Payer: Medicare Other

## 2020-07-10 ENCOUNTER — Ambulatory Visit: Payer: Self-pay

## 2020-07-13 ENCOUNTER — Ambulatory Visit (INDEPENDENT_AMBULATORY_CARE_PROVIDER_SITE_OTHER): Payer: Medicare Other

## 2020-07-13 ENCOUNTER — Other Ambulatory Visit: Payer: Self-pay

## 2020-07-13 DIAGNOSIS — R972 Elevated prostate specific antigen [PSA]: Secondary | ICD-10-CM | POA: Diagnosis not present

## 2020-07-13 NOTE — Progress Notes (Addendum)
4KSCORE  Due to elevated PSA level patient presents today for a 4KSCORE  Blood was drawn from Left antecubital fossa using a 23G needle and syringe to deposit blood into a SST tube.  Patient tolerated well, no complications were noted.  Blood collected today was spun and labeled for packaging. Requisition form was filled out and signed by provider and patient.  [employer] was contacted for pick up of specimen, pick up confirmation number [id number].    Preformed by: Debbe Bales, CMA   Additional notes/ Follow up: Will call with results.    If Patient has medicare make sure they are aware, Medicare is only covering the 4K draw if it meets this criteria:  Elevated PSA  DRE no nodules  Pervious Negative Biopsy

## 2020-07-17 ENCOUNTER — Other Ambulatory Visit: Payer: Self-pay | Admitting: Urology

## 2020-07-24 ENCOUNTER — Encounter: Payer: Self-pay | Admitting: Urology

## 2020-08-02 ENCOUNTER — Encounter: Payer: Self-pay | Admitting: Urology

## 2020-08-13 ENCOUNTER — Encounter: Payer: Self-pay | Admitting: *Deleted

## 2020-08-21 ENCOUNTER — Ambulatory Visit
Admission: RE | Admit: 2020-08-21 | Discharge: 2020-08-21 | Disposition: A | Discharge status: Home / Self Care | Payer: Medicare Other | Source: Ambulatory Visit | Attending: Urology | Admitting: Urology

## 2020-08-21 ENCOUNTER — Encounter: Payer: Self-pay | Admitting: Urology

## 2020-08-21 ENCOUNTER — Ambulatory Visit (INDEPENDENT_AMBULATORY_CARE_PROVIDER_SITE_OTHER): Payer: Medicare Other | Admitting: Urology

## 2020-08-21 ENCOUNTER — Other Ambulatory Visit: Payer: Self-pay

## 2020-08-21 VITALS — BP 157/84 | HR 71 | Ht 68.0 in | Wt 206.0 lb

## 2020-08-21 DIAGNOSIS — N401 Enlarged prostate with lower urinary tract symptoms: Secondary | ICD-10-CM

## 2020-08-21 DIAGNOSIS — R109 Unspecified abdominal pain: Secondary | ICD-10-CM

## 2020-08-21 LAB — MICROSCOPIC EXAMINATION: RBC, Urine: NONE SEEN /hpf (ref 0–2)

## 2020-08-21 LAB — URINALYSIS, COMPLETE
Bilirubin, UA: NEGATIVE
Leukocytes,UA: NEGATIVE
Nitrite, UA: NEGATIVE
RBC, UA: NEGATIVE
Specific Gravity, UA: >1.03 — ABNORMAL HIGH (ref 1.005–1.030)
Urobilinogen, Ur: 0.2 mg/dL (ref 0.2–1.0)
pH, UA: 5 (ref 5.0–7.5)

## 2020-08-21 MED ORDER — GENTAMICIN SULFATE 40 MG/ML IJ SOLN
80.0000 mg | Freq: Once | INTRAMUSCULAR | Status: AC
  Administered 2020-08-21: 80 mg via INTRAMUSCULAR

## 2020-08-21 MED ORDER — LEVOFLOXACIN 500 MG PO TABS
500.0000 mg | ORAL_TABLET | Freq: Once | ORAL | Status: AC
Start: 2020-08-21 — End: 2020-08-21
  Administered 2020-08-21: 500 mg via ORAL

## 2020-08-21 NOTE — Progress Notes (Signed)
Prostate Biopsy Procedure   Informed consent was obtained after discussing risks/benefits of the procedure.  A time out was performed to ensure correct patient identity.  Pre-Procedure: - Last PSA Level: 7.4 with a repeat April 2021 5.9; initially elected MRI however could not be performed secondary to bilateral hip replacements; 4Kscore with 19% probability of Gleason 7 or greater prostate cancer (PSA 6.19)  - Gentamicin given prophylactically - Levaquin 500 mg administered PO -Transrectal Ultrasound performed revealing a 89 gm prostate -No significant hypoechoic or median lobe noted  Procedure: - Prostate block performed using 10 cc 1% lidocaine and biopsies taken from sextant areas, a total of 12 under ultrasound guidance.  Post-Procedure: - Patient tolerated the procedure well - He was counseled to seek immediate medical attention if experiences any severe pain, significant bleeding, or fevers - Return in one week to discuss biopsy results   He also has a history of kidney stones and has had right low back pain for the past 2 weeks.  It is below the waist and in the right buttock region.  Urinalysis today showed no pyuria or microhematuria.  Feel this is most likely musculoskeletal in etiology.  He would like to get a KUB today and will call with results  He also is having bothersome lower urinary tract symptoms and is interested in some type of outlet procedure if his biopsy is negative  Irineo Axon, MD

## 2020-08-24 ENCOUNTER — Encounter: Payer: Self-pay | Admitting: *Deleted

## 2020-08-25 LAB — SURGICAL PATHOLOGY

## 2020-08-27 ENCOUNTER — Telehealth: Payer: Self-pay | Admitting: Urology

## 2020-08-27 NOTE — Telephone Encounter (Signed)
I contacted Mr. Pro regarding his prostate biopsy results.  He had no post biopsy complaints.  Prostate volume was 89 cc and no echogenic abnormalities were identified.  No median lobe was identified.  Standard 12 core biopsies were performed.  All biopsies were negative for malignancy.  There was a focus of atypical glands in the right lateral base.  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  Recommend a follow-up office visit in 6 months with PSA/DRE  He does have bothersome lower urinary tract symptoms and is considering UroLift in the future.  We did discuss the need for office cystoscopy prior to scheduling and he will call back earlier if he desires to proceed.

## 2020-09-02 ENCOUNTER — Ambulatory Visit: Payer: Medicare Other | Admitting: Urology

## 2020-12-02 ENCOUNTER — Other Ambulatory Visit: Payer: Self-pay

## 2020-12-02 ENCOUNTER — Encounter: Payer: Self-pay | Admitting: Urology

## 2020-12-02 ENCOUNTER — Ambulatory Visit (INDEPENDENT_AMBULATORY_CARE_PROVIDER_SITE_OTHER): Payer: Medicare Other | Admitting: Urology

## 2020-12-02 VITALS — BP 131/77 | HR 86 | Ht 68.0 in | Wt 196.0 lb

## 2020-12-02 DIAGNOSIS — Z87442 Personal history of urinary calculi: Secondary | ICD-10-CM

## 2020-12-02 DIAGNOSIS — R1030 Lower abdominal pain, unspecified: Secondary | ICD-10-CM | POA: Diagnosis not present

## 2020-12-02 DIAGNOSIS — M549 Dorsalgia, unspecified: Secondary | ICD-10-CM

## 2020-12-02 NOTE — Progress Notes (Signed)
12/02/2020 9:36 AM   Jake Hudson 1941-09-08 [id number]  Referring provider: [name], MD [address] [place],  [place] [address]  Chief Complaint  Patient presents with  . Groin Pain    HPI: 80 y.o. male presents for evaluation of right groin pain.   Previously followed for BPH and an elevated PSA with biopsy December 2021  Shortly after biopsy he developed some right back pain and a KUB was performed which showed no evidence of stone  His pain progressed to the right hip region and he was seen at [facility] urgent care; subsequently referred to a spine doctor and underwent imaging and physical therapy  Also developed intermittent, stabbing right groin pain occasionally feeling like a shock sensation  Had also noted some sensitivity and tingling of the right leg to the anterior thigh  He underwent epidural steroid injections which improved his back and hip pain though his groin pain has persisted  He had discontinued tramadol and gabapentin and went back on gabapentin which has slightly improved his groin pain  Bothersome LUTS which are stable   PMH: Past Medical History:  Diagnosis Date  . Arrhythmia   . Arthritis   . Atrial fibrillation (HCC)   . Depression   . Diabetes mellitus without complication (HCC)   . Heart disease   . Hyperlipidemia   . Hypertension   . Sleep apnea     Surgical History: Past Surgical History:  Procedure Laterality Date  . CARPAL TUNNEL RELEASE    . CHOLECYSTECTOMY    . CORONARY ARTERY BYPASS GRAFT    . CYST REMOVAL LEG    . HERNIA REPAIR    . KIDNEY STONE SURGERY    . LEFT HEART CATH AND CORONARY ANGIOGRAPHY Left 07/08/2020   Procedure: LEFT HEART CATH AND CORONARY ANGIOGRAPHY;  Surgeon: Marcina Millard, MD;  Location: [facility] INVASIVE CV LAB;  Service: Cardiovascular;  Laterality: Left;  . NASAL SEPTUM SURGERY    . ROTATOR CUFF REPAIR    . TONSILLECTOMY    . TOTAL HIP ARTHROPLASTY      Home  Medications:  Allergies as of 12/02/2020      Reactions   Rosuvastatin    Muscle Pain   Sulfa Antibiotics Itching      Medication List       Accurate as of December 02, 2020  9:36 AM. If you have any questions, ask your nurse or doctor.        acetaminophen 500 MG tablet Commonly known as: TYLENOL Take 1,000 mg by mouth every 6 (six) hours as needed for moderate pain or headache.   ASHWAGANDHA PO Take 1 capsule by mouth daily.   aspirin EC 81 MG tablet Take 81 mg by mouth daily. Swallow whole.   cholecalciferol 25 MCG (1000 UNIT) tablet Commonly known as: VITAMIN D3 Take 1,000 Units by mouth daily.   donepezil 10 MG tablet Commonly known as: ARICEPT Take 10 mg by mouth at bedtime.   Dulaglutide 0.75 MG/0.5ML Sopn Inject 0.75 mg as directed every Sunday.   gabapentin 100 MG capsule Commonly known as: NEURONTIN Take 100 mg by mouth daily as needed (pain).   glimepiride 4 MG tablet Commonly known as: AMARYL Take 4 mg by mouth 2 (two) times daily.   glucose blood test strip USE 3 TIMES DAILY   hydrochlorothiazide 25 MG tablet Commonly known as: HYDRODIURIL Take by mouth.   LYSINE PO Take 3 capsules by mouth daily as needed (immune support).  meloxicam 7.5 MG tablet Commonly known as: MOBIC Take 7.5 mg by mouth daily.   metFORMIN 500 MG tablet Commonly known as: GLUCOPHAGE Take 500 mg by mouth 2 (two) times daily.   metoprolol succinate 25 MG 24 hr tablet Commonly known as: TOPROL-XL Take 25 mg by mouth daily.   pantoprazole 40 MG tablet Commonly known as: PROTONIX Take 40 mg by mouth daily.   potassium chloride 10 MEQ tablet Commonly known as: KLOR-CON Take by mouth.   PROSTATE HEALTH PO Take 1 tablet by mouth in the morning and at bedtime.   Repatha SureClick 140 MG/ML Soaj Generic drug: Evolocumab Inject into the skin.   rosuvastatin 20 MG tablet Commonly known as: CRESTOR Take 20 mg by mouth at bedtime.   tadalafil 20 MG  tablet Commonly known as: CIALIS TAKE (1) TABLET BY MOUTH AS NEEDED 1 HOUR PRIOR TO INTERCOURSE What changed:   how much to take  how to take this  when to take this  reasons to take this  additional instructions   tamsulosin 0.4 MG Caps capsule Commonly known as: FLOMAX Take 0.4 mg by mouth 2 (two) times daily.   ZICAM COLD REMEDY PO Take 1 tablet by mouth every 6 (six) hours as needed (allergies).       Allergies:  Allergies  Allergen Reactions  . Rosuvastatin     Muscle Pain  . Sulfa Antibiotics Itching    Family History: History reviewed. No pertinent family history.  Social History:  reports that he has never smoked. He has never used smokeless tobacco. He reports previous alcohol use. He reports that he does not use drugs.   Physical Exam: BP 131/77   Pulse 86   Ht 5\' 8"  (1.727 m)   Wt 196 lb (88.9 kg)   BMI 29.80 kg/m   Constitutional:  Alert and oriented, No acute distress. HEENT: [place] AT, moist mucus membranes.  Trachea midline, no masses. Cardiovascular: No clubbing, cyanosis, or edema. Respiratory: Normal respiratory effort, no increased work of breathing. GI: Abdomen is soft, nontender, nondistended, no abdominal masses GU: No right groin mass, or evidence of hernia Lymph: No inguinal lymphadenopathy. Skin: No rashes, bruises or suspicious lesions.   Assessment & Plan:    1. 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 stone or inguinal pathology, i.e. hernia  2.  BPH with LUTS  He may be interested in an outlet procedure in the future   , MD  [facility] [address] [place], [place] [phone]

## 2020-12-03 LAB — URINALYSIS, COMPLETE
Bilirubin, UA: NEGATIVE
Ketones, UA: NEGATIVE
Leukocytes,UA: NEGATIVE
Nitrite, UA: NEGATIVE
Protein,UA: NEGATIVE
RBC, UA: NEGATIVE
Specific Gravity, UA: 1.025 (ref 1.005–1.030)
Urobilinogen, Ur: 0.2 mg/dL (ref 0.2–1.0)
pH, UA: 5 (ref 5.0–7.5)

## 2020-12-03 LAB — MICROSCOPIC EXAMINATION
Bacteria, UA: NONE SEEN
RBC, Urine: NONE SEEN /hpf (ref 0–2)
WBC, UA: NONE SEEN /hpf (ref 0–5)

## 2020-12-10 ENCOUNTER — Other Ambulatory Visit: Payer: Self-pay

## 2020-12-10 ENCOUNTER — Ambulatory Visit
Admission: RE | Admit: 2020-12-10 | Discharge: 2020-12-10 | Disposition: A | Discharge status: Home / Self Care | Payer: Medicare Other | Source: Ambulatory Visit | Attending: Urology | Admitting: Urology

## 2020-12-10 DIAGNOSIS — Z87442 Personal history of urinary calculi: Secondary | ICD-10-CM | POA: Insufficient documentation

## 2020-12-10 DIAGNOSIS — R1030 Lower abdominal pain, unspecified: Secondary | ICD-10-CM | POA: Insufficient documentation

## 2020-12-10 DIAGNOSIS — M549 Dorsalgia, unspecified: Secondary | ICD-10-CM | POA: Insufficient documentation

## 2020-12-11 ENCOUNTER — Telehealth: Payer: Self-pay | Admitting: *Deleted

## 2020-12-11 NOTE — Telephone Encounter (Signed)
Notified patient as instructed,.  

## 2020-12-11 NOTE — Telephone Encounter (Signed)
-----   Message from [name], MD sent at 12/11/2020  3:17 PM EDT ----- CT showed no evidence of kidney blockage, stone or any obvious urologic cause of his pain

## 2020-12-29 ENCOUNTER — Other Ambulatory Visit: Payer: Self-pay | Admitting: *Deleted

## 2020-12-29 DIAGNOSIS — R972 Elevated prostate specific antigen [PSA]: Secondary | ICD-10-CM

## 2021-03-01 ENCOUNTER — Other Ambulatory Visit: Payer: Self-pay

## 2021-03-01 ENCOUNTER — Other Ambulatory Visit: Payer: Medicare Other

## 2021-03-01 DIAGNOSIS — R972 Elevated prostate specific antigen [PSA]: Secondary | ICD-10-CM

## 2021-03-02 LAB — PSA: Prostate Specific Ag, Serum: 5.8 ng/mL — ABNORMAL HIGH (ref 0.0–4.0)

## 2021-03-03 ENCOUNTER — Ambulatory Visit: Payer: Medicare Other | Admitting: Urology

## 2021-03-04 ENCOUNTER — Ambulatory Visit (INDEPENDENT_AMBULATORY_CARE_PROVIDER_SITE_OTHER): Payer: Medicare Other | Admitting: Urology

## 2021-03-04 ENCOUNTER — Encounter: Payer: Self-pay | Admitting: Urology

## 2021-03-04 ENCOUNTER — Other Ambulatory Visit: Payer: Self-pay

## 2021-03-04 VITALS — BP 145/73 | HR 77 | Ht 68.0 in | Wt 200.0 lb

## 2021-03-04 DIAGNOSIS — R972 Elevated prostate specific antigen [PSA]: Secondary | ICD-10-CM

## 2021-03-04 DIAGNOSIS — N401 Enlarged prostate with lower urinary tract symptoms: Secondary | ICD-10-CM | POA: Diagnosis not present

## 2021-03-04 NOTE — Progress Notes (Signed)
03/04/2021 9:13 AM   Jake Hudson Aug 18, 1941 [id number]  Referring provider: [name], MD [address] [place],  [place] [address]  Chief Complaint  Patient presents with   Elevated PSA    Urologic history: 1.  Elevated PSA MRI could not be performed secondary to bilateral hip replacement 4Kscore with elevated risk at 19% (PSA 6.19) Prostate biopsy 08/21/2020 PSA 7.4 (repeat 5.9) 89 g prostate; Path focus atypical glands RLB  2.  BPH with LUTS Moderate LUTS; tamsulosin 0.8 mg daily   HPI: 80 y.o. male presents for semiannual follow-up.  Requested visit 11/2020 for right groin pain which he states resolved after L4-5 epidural steroid injections LUTS are stable on tamsulosin Denies dysuria, gross hematuria Denies flank, abdominal or pelvic pain PSA 03/01/2021 stable 5.8      PMH: Past Medical History:  Diagnosis Date   Arrhythmia    Arthritis    Atrial fibrillation (HCC)    Depression    Diabetes mellitus without complication (HCC)    Heart disease    Hyperlipidemia    Hypertension    Sleep apnea     Surgical History: Past Surgical History:  Procedure Laterality Date   CARPAL TUNNEL RELEASE     CHOLECYSTECTOMY     CORONARY ARTERY BYPASS GRAFT     CYST REMOVAL LEG     HERNIA REPAIR     KIDNEY STONE SURGERY     LEFT HEART CATH AND CORONARY ANGIOGRAPHY Left 07/08/2020   Procedure: LEFT HEART CATH AND CORONARY ANGIOGRAPHY;  Surgeon: Marcina Millard, MD;  Location: [facility] INVASIVE CV LAB;  Service: Cardiovascular;  Laterality: Left;   NASAL SEPTUM SURGERY     ROTATOR CUFF REPAIR     TONSILLECTOMY     TOTAL HIP ARTHROPLASTY      Home Medications:  Allergies as of 03/04/2021       Reactions   Rosuvastatin    Muscle Pain   Sulfa Antibiotics Itching        Medication List        Accurate as of March 04, 2021  9:13 AM. If you have any questions, ask your nurse or doctor.          acetaminophen 500 MG tablet Commonly  known as: TYLENOL Take 1,000 mg by mouth every 6 (six) hours as needed for moderate pain or headache.   ASHWAGANDHA PO Take 1 capsule by mouth daily.   aspirin EC 81 MG tablet Take 81 mg by mouth daily. Swallow whole.   cholecalciferol 25 MCG (1000 UNIT) tablet Commonly known as: VITAMIN D3 Take 1,000 Units by mouth daily.   donepezil 10 MG tablet Commonly known as: ARICEPT Take 10 mg by mouth at bedtime.   Dulaglutide 0.75 MG/0.5ML Sopn Inject 0.75 mg as directed every Sunday.   gabapentin 100 MG capsule Commonly known as: NEURONTIN Take 100 mg by mouth daily as needed (pain).   glimepiride 4 MG tablet Commonly known as: AMARYL Take 4 mg by mouth 2 (two) times daily.   glucose blood test strip USE 3 TIMES DAILY   hydrochlorothiazide 25 MG tablet Commonly known as: HYDRODIURIL Take by mouth.   LYSINE PO Take 3 capsules by mouth daily as needed (immune support).   meloxicam 7.5 MG tablet Commonly known as: MOBIC Take 7.5 mg by mouth daily.   metFORMIN 500 MG tablet Commonly known as: GLUCOPHAGE Take 500 mg by mouth 2 (two) times daily.   metoprolol succinate 25 MG 24 hr tablet Commonly  known as: TOPROL-XL Take 25 mg by mouth daily.   pantoprazole 40 MG tablet Commonly known as: PROTONIX Take 40 mg by mouth daily.   potassium chloride 10 MEQ tablet Commonly known as: KLOR-CON Take by mouth.   PROSTATE HEALTH PO Take 1 tablet by mouth in the morning and at bedtime.   Repatha SureClick 140 MG/ML Soaj Generic drug: Evolocumab Inject into the skin.   rosuvastatin 20 MG tablet Commonly known as: CRESTOR Take 20 mg by mouth at bedtime.   tadalafil 20 MG tablet Commonly known as: CIALIS TAKE (1) TABLET BY MOUTH AS NEEDED 1 HOUR PRIOR TO INTERCOURSE What changed:  how much to take how to take this when to take this reasons to take this additional instructions   tamsulosin 0.4 MG Caps capsule Commonly known as: FLOMAX Take 0.4 mg by mouth 2 (two)  times daily.   ZICAM COLD REMEDY PO Take 1 tablet by mouth every 6 (six) hours as needed (allergies).        Allergies:  Allergies  Allergen Reactions   Rosuvastatin     Muscle Pain   Sulfa Antibiotics Itching    Family History: No family history on file.  Social History:  reports that he has never smoked. He has never used smokeless tobacco. He reports previous alcohol use. He reports that he does not use drugs.   Physical Exam: BP (!) 145/73   Pulse 77   Ht 5\' 8"  (1.727 m)   Wt 200 lb (90.7 kg)   BMI 30.41 kg/m   Constitutional:  Alert and oriented, No acute distress. HEENT: [place] AT, moist mucus membranes.  Trachea midline, no masses. Cardiovascular: No clubbing, cyanosis, or edema. Respiratory: Normal respiratory effort, no increased work of breathing. GU: Declined DRE Lymph: No cervical or inguinal lymphadenopathy. Skin: No rashes, bruises or suspicious lesions. Neurologic: Grossly intact, no focal deficits, moving all 4 extremities. Psychiatric: Normal mood and affect.   Assessment & Plan:    1.  Elevated PSA Stable 6 month follow-up PSA/DRE  2.  BPH with LUTS Moderate LUTS which are stable on tamsulosin He may be interested in an outlet procedure in the future   , MD  [facility] [address] [place], [place] [phone]

## 2021-04-14 ENCOUNTER — Ambulatory Visit
Admission: EM | Admit: 2021-04-14 | Discharge: 2021-04-14 | Disposition: A | Discharge status: Home / Self Care | Payer: Medicare Other | Attending: Emergency Medicine | Admitting: Emergency Medicine

## 2021-04-14 ENCOUNTER — Other Ambulatory Visit: Payer: Self-pay

## 2021-04-14 DIAGNOSIS — J019 Acute sinusitis, unspecified: Secondary | ICD-10-CM

## 2021-04-14 MED ORDER — DOXYCYCLINE HYCLATE 100 MG PO CAPS: 100.0000 mg | ORAL_CAPSULE | Freq: Two times a day (BID) | ORAL | 0 refills | Status: AC

## 2021-04-14 MED ORDER — BENZONATATE 200 MG PO CAPS: 200.0000 mg | ORAL_CAPSULE | Freq: Three times a day (TID) | ORAL | 0 refills | Status: DC | PRN

## 2021-04-14 MED ORDER — FLUTICASONE PROPIONATE 50 MCG/ACT NA SUSP
2.0000 | Freq: Every day | NASAL | 0 refills | Status: DC
Start: 2021-04-14 — End: 2022-08-02

## 2021-04-14 NOTE — Discharge Instructions (Addendum)
Discontinue Claritin, start Mucinex to keep the mucous thin and to decongest you.  Finish doxycycline, even if you feel better.  Use a NeilMed sinus rinse with distilled water as often as you want to to reduce nasal congestion. Follow the directions on the box.  Flonase for the nasal congestion and postnasal drip, Tessalon for the cough.  Go to www.goodrx.com to look up your medications. This will give you a list of where you can find your prescriptions at the most affordable prices. Or you can ask the pharmacist what the cash price is. This is frequently cheaper than going through insurance.

## 2021-04-14 NOTE — ED Provider Notes (Signed)
HPI  SUBJECTIVE:  Jake Hudson is a 80 y.o. male who presents with over 2 weeks of sinus pain and pressure, yellow rhinorrhea, nasal congestion, chest congestion, cough productive of the same material as his nasal congestion, sore throat, postnasal drip.  Sore throat has resolved.  States that he is unable to sleep at night secondary to the cough.  No fevers, facial swelling, upper dental pain, chest pain, definite shortness of breath, wheezing.  No antibiotics in the past 3 months.  No Antipyretic in past 6 hours.  He had a negative home COVID test earlier in the course of the illness.  He has tried Claritin and Coricidin.  The procedure and helps.  Symptoms are worse in the morning.  He has a past medical history of postoperative atrial fibrillation which has resolved.  He is not on any anticoagulants.  He has a history of chronic kidney disease stage III, diabetes, hypertension, status post CABG, asthma as a child, BPH.  He is not a smoker.  [id number], Madaline Guthrie, MD     Past Medical History:  Diagnosis Date   Arrhythmia    Arthritis    Atrial fibrillation (HCC)    Depression    Diabetes mellitus without complication (HCC)    Heart disease    Hyperlipidemia    Hypertension    Sleep apnea     Past Surgical History:  Procedure Laterality Date   CARPAL TUNNEL RELEASE     CHOLECYSTECTOMY     CORONARY ARTERY BYPASS GRAFT     CYST REMOVAL LEG     HERNIA REPAIR     KIDNEY STONE SURGERY     LEFT HEART CATH AND CORONARY ANGIOGRAPHY Left 07/08/2020   Procedure: LEFT HEART CATH AND CORONARY ANGIOGRAPHY;  Surgeon: Marcina Millard, MD;  Location: [facility] INVASIVE CV [facility];  Service: Cardiovascular;  Laterality: Left;   NASAL SEPTUM SURGERY     ROTATOR CUFF REPAIR     TONSILLECTOMY     TOTAL HIP ARTHROPLASTY      History reviewed. No pertinent family history.  Social History   Tobacco Use   Smoking status: Never   Smokeless tobacco: Never  Vaping Use   Vaping Use:  Never used  Substance Use Topics   Alcohol use: Not Currently   Drug use: Never    No current facility-administered medications for this encounter.  Current Outpatient Medications:    acetaminophen (TYLENOL) 500 MG tablet, Take 1,000 mg by mouth every 6 (six) hours as needed for moderate pain or headache., Disp: , Rfl:    ASHWAGANDHA PO, Take 1 capsule by mouth daily., Disp: , Rfl:    aspirin EC 81 MG tablet, Take 81 mg by mouth daily. Swallow whole., Disp: , Rfl:    benzonatate (TESSALON) 200 MG capsule, Take 1 capsule (200 mg total) by mouth 3 (three) times daily as needed for cough., Disp: 30 capsule, Rfl: 0   cholecalciferol (VITAMIN D3) 25 MCG (1000 UNIT) tablet, Take 1,000 Units by mouth daily., Disp: , Rfl:    donepezil (ARICEPT) 10 MG tablet, Take 10 mg by mouth at bedtime., Disp: , Rfl:    doxycycline (VIBRAMYCIN) 100 MG capsule, Take 1 capsule (100 mg total) by mouth 2 (two) times daily for 10 days., Disp: 20 capsule, Rfl: 0   Dulaglutide 0.75 MG/0.5ML SOPN, Inject 0.75 mg as directed every Sunday. , Disp: , Rfl:    Evolocumab (REPATHA SURECLICK) 140 MG/ML SOAJ, Inject into the skin., Disp: , Rfl:  fluticasone (FLONASE) 50 MCG/ACT nasal spray, Place 2 sprays into both nostrils daily., Disp: 16 g, Rfl: 0   gabapentin (NEURONTIN) 100 MG capsule, Take 100 mg by mouth daily as needed (pain). , Disp: , Rfl:    glimepiride (AMARYL) 4 MG tablet, Take 4 mg by mouth 2 (two) times daily. , Disp: , Rfl:    glucose blood test strip, USE 3 TIMES DAILY, Disp: , Rfl:    Homeopathic Products (ZICAM COLD REMEDY PO), Take 1 tablet by mouth every 6 (six) hours as needed (allergies)., Disp: , Rfl:    hydrochlorothiazide (HYDRODIURIL) 25 MG tablet, Take by mouth., Disp: , Rfl:    LYSINE PO, Take 3 capsules by mouth daily as needed (immune support)., Disp: , Rfl:    meloxicam (MOBIC) 7.5 MG tablet, Take 7.5 mg by mouth daily., Disp: , Rfl:    metFORMIN (GLUCOPHAGE) 500 MG tablet, Take 500 mg by mouth  2 (two) times daily., Disp: , Rfl:    metoprolol succinate (TOPROL-XL) 25 MG 24 hr tablet, Take 25 mg by mouth daily. , Disp: , Rfl:    Misc Natural Products (PROSTATE HEALTH PO), Take 1 tablet by mouth in the morning and at bedtime. , Disp: , Rfl:    pantoprazole (PROTONIX) 40 MG tablet, Take 40 mg by mouth daily., Disp: , Rfl:    potassium chloride (KLOR-CON) 10 MEQ tablet, Take by mouth., Disp: , Rfl:    rosuvastatin (CRESTOR) 20 MG tablet, Take 20 mg by mouth at bedtime., Disp: , Rfl:    tadalafil (CIALIS) 20 MG tablet, TAKE (1) TABLET BY MOUTH AS NEEDED 1 HOUR PRIOR TO INTERCOURSE (Patient taking differently: Take 20 mg by mouth daily as needed for erectile dysfunction. 1 HOUR PRIOR TO INTERCOURSE), Disp: 30 tablet, Rfl: 3   tamsulosin (FLOMAX) 0.4 MG CAPS capsule, Take 0.4 mg by mouth 2 (two) times daily. , Disp: , Rfl:   Allergies  Allergen Reactions   Rosuvastatin     Muscle Pain   Sulfa Antibiotics Itching     ROS  As noted in HPI.   Physical Exam  BP 129/70 (BP Location: Right Arm)   Pulse 89   Temp 98.4 F (36.9 C) (Oral)   Resp 18   Ht 5\' 8"  (1.727 m)   Wt 90.7 kg   SpO2 97%   BMI 30.41 kg/m   Constitutional: Well developed, well nourished, no acute distress Eyes:  EOMI, conjunctiva normal bilaterally HENT: Normocephalic, atraumatic,mucus membranes moist.  Purulent nasal congestion.  Normal turbinates.  No maxillary, frontal sinus tenderness.  Positive postnasal drip. Respiratory: Normal inspiratory effort, lungs clear bilaterally Cardiovascular: Normal rate, regular rhythm, no murmurs rubs or gallops GI: nondistended skin: No rash, skin intact Musculoskeletal: no deformities Neurologic: Alert & oriented x 3, no focal neuro deficits Psychiatric: Speech and behavior appropriate   ED Course   Medications - No data to display  No orders of the defined types were placed in this encounter.   No results found for this or any previous visit (from the past 24  hour(s)). No results found.  ED Clinical Impression  1. Acute non-recurrent sinusitis, unspecified location      ED Assessment/Plan  Patient with a sinusitis.  Given duration of symptoms, feel that antibiotics are warranted.  Did not check a COVID because it would not change management.  He is out of the treatment window for antivirals.  Calculated creatinine clearance 64 mL/min based on labs from 03/29/2021  Home with doxycycline, because  it does not need to be renally dosed, Flonase, saline nasal irrigation, Mucinex, discontinue Claritin, Tessalon.  None of these need to be renally dosed.  Follow-up with Dr. Maryjane Hurter as needed.  Discussed labs, imaging, MDM, treatment plan, and plan for follow-up with patient.patient agrees with plan.   Meds ordered this encounter  Medications   doxycycline (VIBRAMYCIN) 100 MG capsule    Sig: Take 1 capsule (100 mg total) by mouth 2 (two) times daily for 10 days.    Dispense:  20 capsule    Refill:  0   fluticasone (FLONASE) 50 MCG/ACT nasal spray    Sig: Place 2 sprays into both nostrils daily.    Dispense:  16 g    Refill:  0   benzonatate (TESSALON) 200 MG capsule    Sig: Take 1 capsule (200 mg total) by mouth 3 (three) times daily as needed for cough.    Dispense:  30 capsule    Refill:  0      *This clinic note was created using Scientist, clinical (histocompatibility and immunogenetics). Therefore, there may be occasional mistakes despite careful proofreading.  ?    Domenick Gong, MD 04/14/21 1547

## 2021-04-14 NOTE — ED Triage Notes (Signed)
Patient states that he has been having a cough, chest congestion, nasal congestion and sinus pain and pressure x 2 weeks.

## 2021-04-21 ENCOUNTER — Other Ambulatory Visit: Payer: Self-pay | Admitting: Urology

## 2021-06-03 IMAGING — CT CT RENAL STONE PROTOCOL
1 of 2 series · 15 of 32 positions shown, 19 images · non-contrast
Comparison: None.

CLINICAL DATA: Right flank pain for 6 months.  Nephrolithiasis.

EXAM:
CT ABDOMEN AND PELVIS WITHOUT CONTRAST
TECHNIQUE: Multidetector CT imaging of the abdomen and pelvis was performed
following the standard protocol without IV contrast.

[Series 2: axial st · axial · 0.86mm/px · z∈[-1076,-606]mm · 15 of 104 slices shown, 19 images]
[im 5/104  soft-tissue]
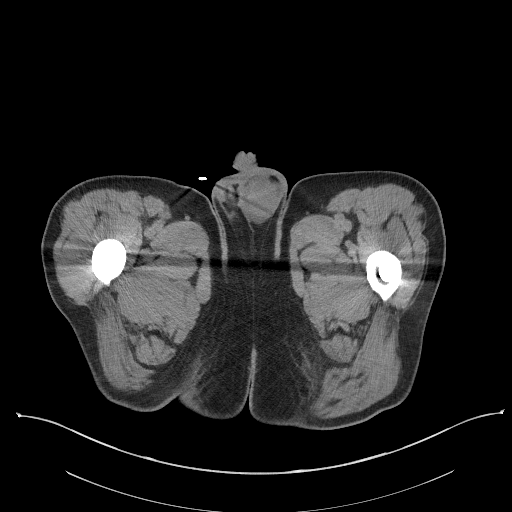
[im 5/104  bone]
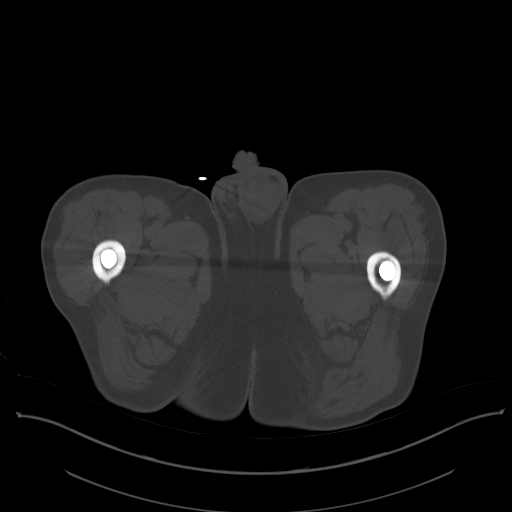
[im 14/104  soft-tissue]
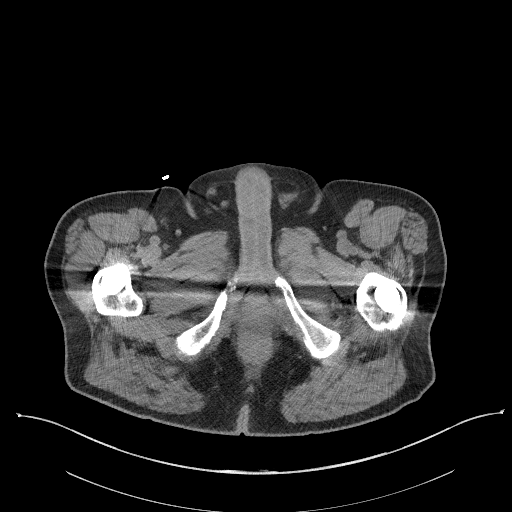
[im 23/104  soft-tissue]
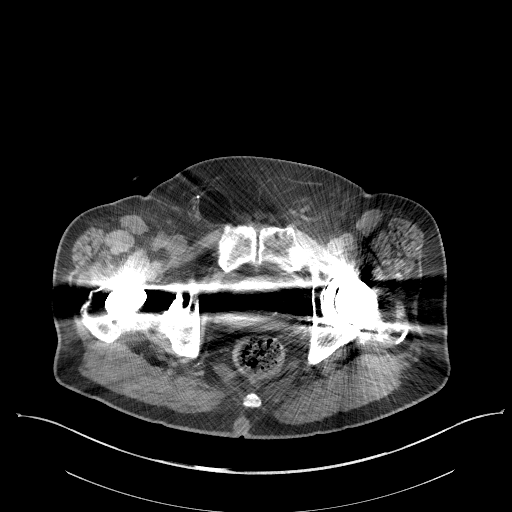
[im 27/104  soft-tissue]
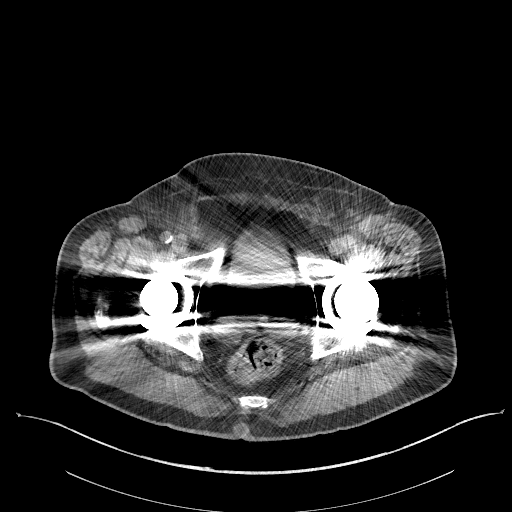
[im 36/104  soft-tissue]
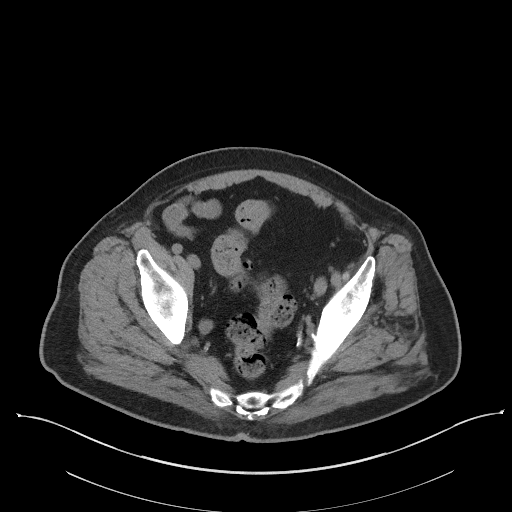
[im 45/104  soft-tissue]
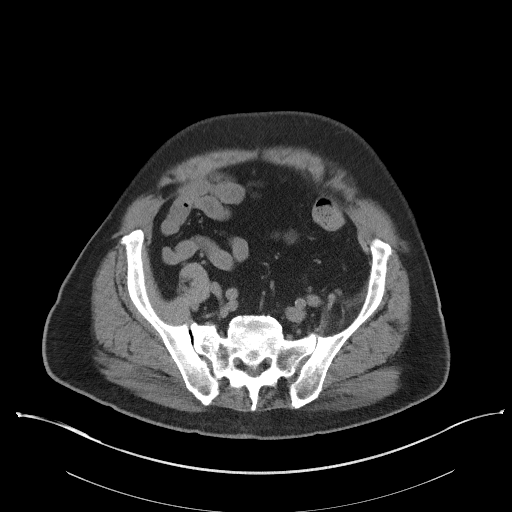
[im 54/104  soft-tissue]
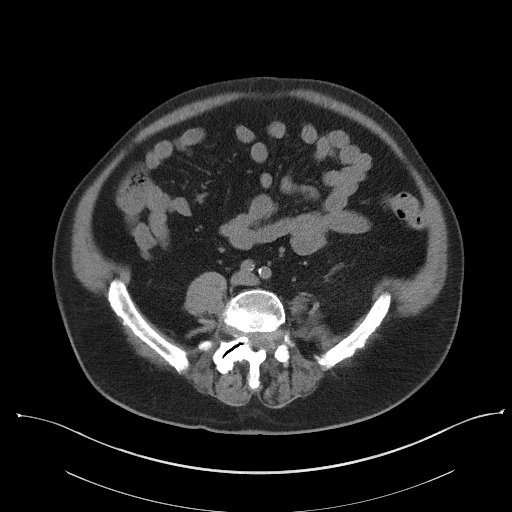
[im 59/104  soft-tissue]
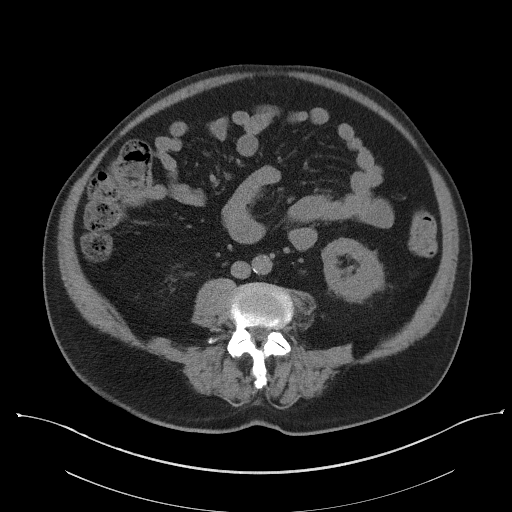
[im 68/104  soft-tissue]
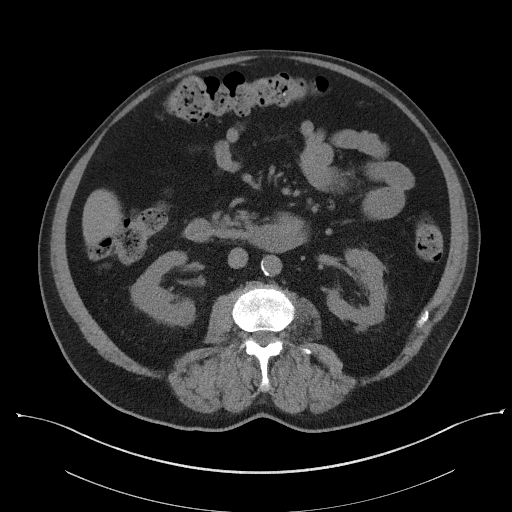
[im 68/104  bone]
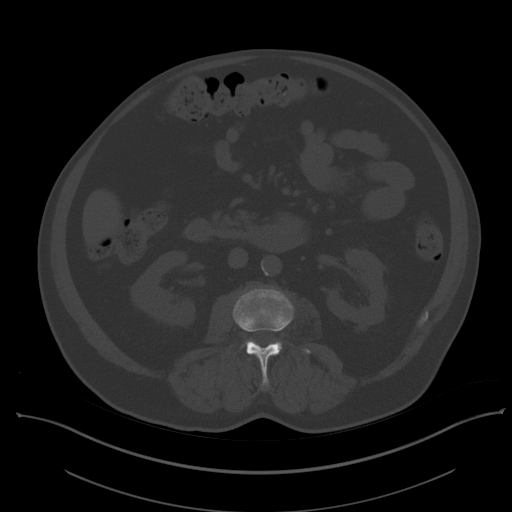
[im 77/104  soft-tissue]
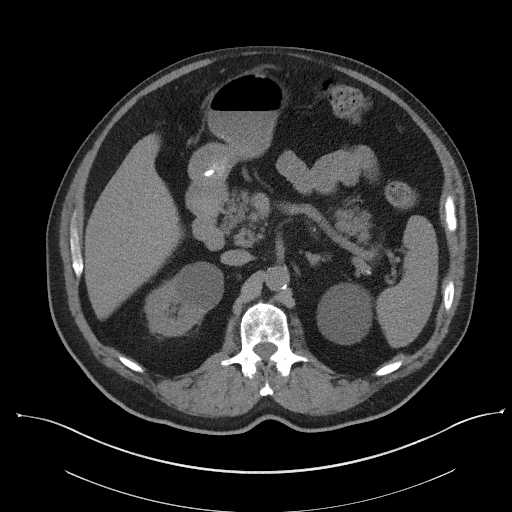
[im 81/104  soft-tissue]
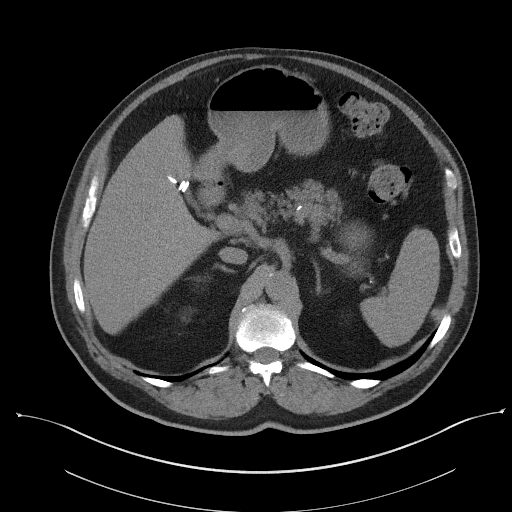
[im 86/104  lung]
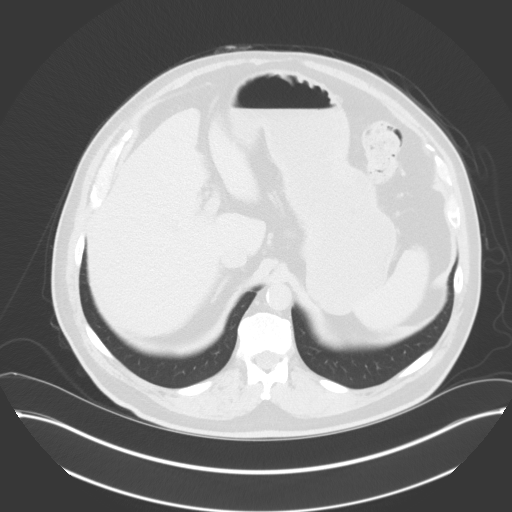
[im 90/104  soft-tissue]
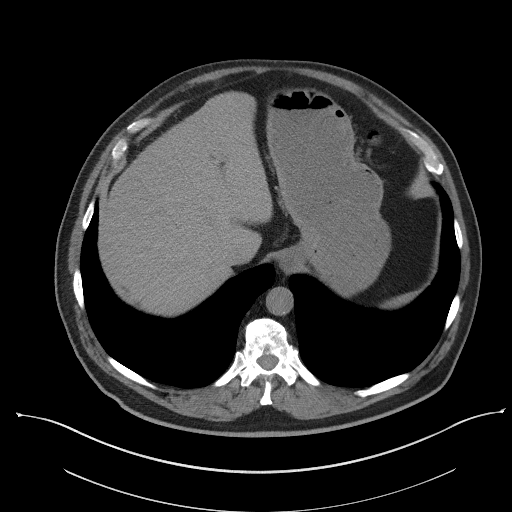
[im 90/104  lung]
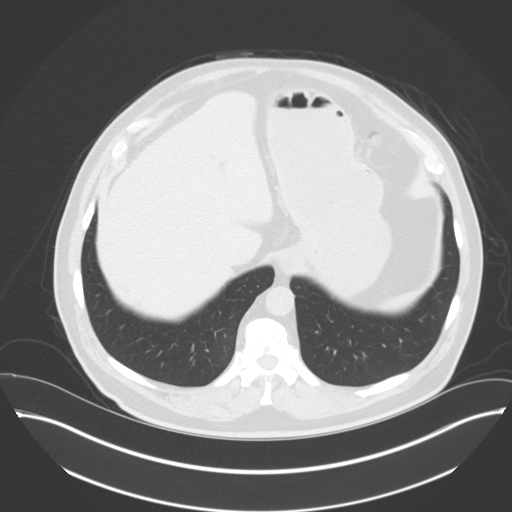
[im 95/104  lung]
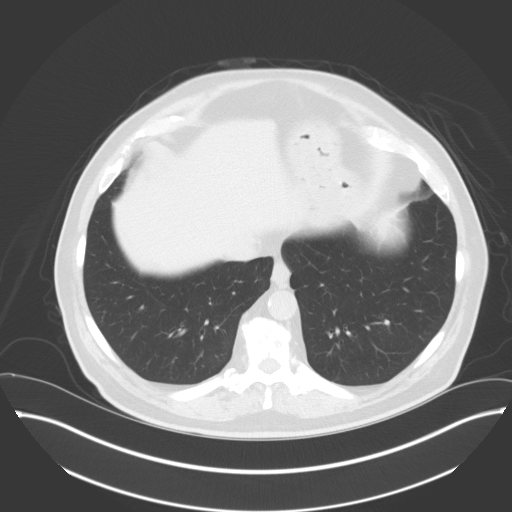
[im 99/104  soft-tissue]
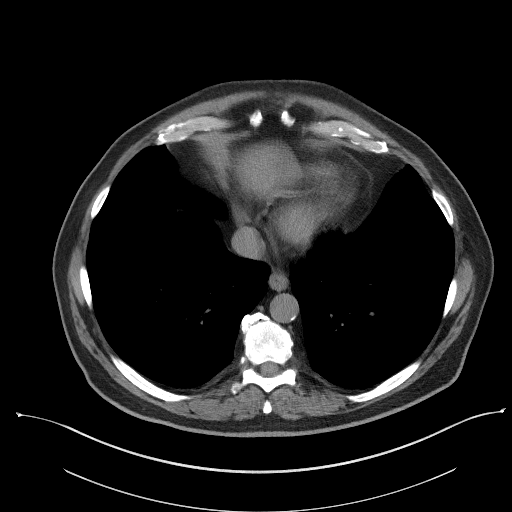
[im 99/104  lung]
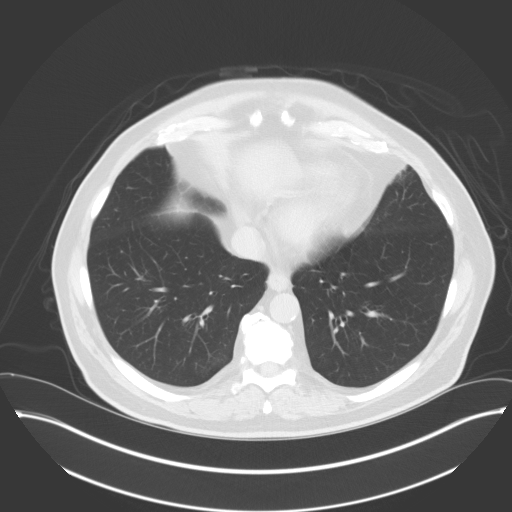

[15 of 32 positions shown; findings below may reference images not displayed]

FINDINGS: Lower chest: No acute findings.

Hepatobiliary: No mass visualized on this unenhanced exam. Prior
cholecystectomy. No evidence of biliary obstruction.

Pancreas: No mass or inflammatory process visualized on this
unenhanced exam.

Spleen:  Within normal limits in size.

Adrenals/Urinary tract: Fluid attenuation cysts are noted in upper
poles of both kidneys. No evidence of urolithiasis or
hydronephrosis. Urinary bladder is obscured by severe beam hardening
artifact from bilateral hip prostheses.

Stomach/Bowel: No evidence of obstruction, inflammatory process, or
abnormal fluid collections.

Vascular/Lymphatic: No pathologically enlarged lymph nodes
identified. No evidence of abdominal aortic aneurysm. Aortic
atherosclerotic calcification noted.

Reproductive:  Mild-to-moderate enlargement of prostate gland noted.

Other: Small paraumbilical ventral hernia is seen containing only
fat. Small right inguinal hernia is also seen containing only fat.

Musculoskeletal: No suspicious bone lesions identified. Bilateral
hip prostheses noted.
IMPRESSION: No evidence of urolithiasis, hydronephrosis, or other acute
findings.

Mild-to-moderately enlarged prostate.

Small paraumbilical and right inguinal hernias, which contain only
fat.

Aortic Atherosclerosis (3420I-0US.S).

## 2021-08-30 ENCOUNTER — Other Ambulatory Visit: Payer: Medicare Other

## 2021-08-30 ENCOUNTER — Other Ambulatory Visit: Payer: Self-pay

## 2021-08-30 DIAGNOSIS — R972 Elevated prostate specific antigen [PSA]: Secondary | ICD-10-CM

## 2021-08-31 LAB — PSA: Prostate Specific Ag, Serum: 6.1 ng/mL — ABNORMAL HIGH (ref 0.0–4.0)

## 2021-09-03 ENCOUNTER — Other Ambulatory Visit: Payer: Self-pay

## 2021-09-08 ENCOUNTER — Ambulatory Visit: Payer: Self-pay | Admitting: Urology

## 2021-09-12 DIAGNOSIS — Z9842 Cataract extraction status, left eye: Secondary | ICD-10-CM

## 2021-09-12 DIAGNOSIS — Z9841 Cataract extraction status, right eye: Secondary | ICD-10-CM

## 2021-09-12 HISTORY — DX: Cataract extraction status, right eye: Z98.42

## 2021-09-12 HISTORY — DX: Cataract extraction status, right eye: Z98.41

## 2021-09-16 ENCOUNTER — Ambulatory Visit: Payer: Medicare Other | Admitting: Urology

## 2021-09-23 ENCOUNTER — Encounter: Payer: Self-pay | Admitting: Urology

## 2021-09-23 ENCOUNTER — Other Ambulatory Visit: Payer: Self-pay

## 2021-09-23 ENCOUNTER — Ambulatory Visit (INDEPENDENT_AMBULATORY_CARE_PROVIDER_SITE_OTHER): Payer: Medicare Other | Admitting: Urology

## 2021-09-23 VITALS — BP 122/72 | HR 92 | Ht 68.0 in | Wt 198.0 lb

## 2021-09-23 DIAGNOSIS — N401 Enlarged prostate with lower urinary tract symptoms: Secondary | ICD-10-CM

## 2021-09-23 DIAGNOSIS — R972 Elevated prostate specific antigen [PSA]: Secondary | ICD-10-CM | POA: Diagnosis not present

## 2021-09-23 MED ORDER — FINASTERIDE 5 MG PO TABS: 5.0000 mg | ORAL_TABLET | Freq: Every day | ORAL | 3 refills | Status: DC

## 2021-09-23 NOTE — Progress Notes (Signed)
09/23/2021 2:09 PM   Jake Hudson Jun 21, 1941 [id number]  Referring provider: Sofie Hartigan, MD [address],  [place] [address]  Chief Complaint  Patient presents with   Elevated PSA    Urologic history: 1.  Elevated PSA MRI could not be performed secondary to bilateral hip replacement 4Kscore with elevated risk at 19% (PSA 6.19) Prostate biopsy 08/21/2020 PSA 7.4 (repeat 5.9) 89 g prostate; Path focus atypical glands RLB   2.  BPH with LUTS Moderate LUTS; tamsulosin 0.8 mg daily   HPI: 81 y.o. male presents for semiannual follow-up.  Doing well since last visit LUTS are stable but still bothersome; remains on tamsulosin 0.8 mg daily States he may want surgery in the future but not ready to proceed  Denies dysuria, gross hematuria Denies flank, abdominal or pelvic pain PSA 08/30/2021 stable at 6.1  PMH: Past Medical History:  Diagnosis Date   Arrhythmia    Arthritis    Atrial fibrillation ([place])    Depression    Diabetes mellitus without complication ([place])    Heart disease    Hyperlipidemia    Hypertension    Sleep apnea     Surgical History: Past Surgical History:  Procedure Laterality Date   CARPAL TUNNEL RELEASE     CHOLECYSTECTOMY     CORONARY ARTERY BYPASS GRAFT     CYST REMOVAL LEG     HERNIA REPAIR     KIDNEY STONE SURGERY     LEFT HEART CATH AND CORONARY ANGIOGRAPHY Left 07/08/2020   Procedure: LEFT HEART CATH AND CORONARY ANGIOGRAPHY;  Surgeon: Isaias Cowman, MD;  Location: [facility];  Service: Cardiovascular;  Laterality: Left;   NASAL SEPTUM SURGERY     ROTATOR CUFF REPAIR     TONSILLECTOMY     TOTAL HIP ARTHROPLASTY      Home Medications:  Allergies as of 09/23/2021       Reactions   Rosuvastatin    Muscle Pain   Sulfa Antibiotics Itching        Medication List        Accurate as of September 23, 2021  2:09 PM. If you have any questions, ask your nurse or doctor.          STOP  taking these medications    ASHWAGANDHA PO Stopped by: Abbie Sons, MD   cholecalciferol 25 MCG (1000 UNIT) tablet Commonly known as: VITAMIN D3 Stopped by: Abbie Sons, MD       TAKE these medications    acetaminophen 500 MG tablet Commonly known as: TYLENOL Take 1,000 mg by mouth every 6 (six) hours as needed for moderate pain or headache.   aspirin EC 81 MG tablet Take 81 mg by mouth daily. Swallow whole.   benzonatate 200 MG capsule Commonly known as: TESSALON Take 1 capsule (200 mg total) by mouth 3 (three) times daily as needed for cough.   donepezil 10 MG tablet Commonly known as: ARICEPT Take 10 mg by mouth at bedtime.   Dulaglutide 0.75 MG/0.5ML Sopn Inject 0.75 mg as directed every Sunday.   fluticasone 50 MCG/ACT nasal spray Commonly known as: FLONASE Place 2 sprays into both nostrils daily.   gabapentin 100 MG capsule Commonly known as: NEURONTIN Take 100 mg by mouth daily as needed (pain).   glimepiride 4 MG tablet Commonly known as: AMARYL Take 4 mg by mouth 2 (two) times daily.   glucose blood test strip USE 3 TIMES DAILY   hydrochlorothiazide  25 MG tablet Commonly known as: HYDRODIURIL Take by mouth.   LYSINE PO Take 3 capsules by mouth daily as needed (immune support).   meloxicam 7.5 MG tablet Commonly known as: MOBIC Take 7.5 mg by mouth daily.   metFORMIN 500 MG tablet Commonly known as: GLUCOPHAGE Take 500 mg by mouth 2 (two) times daily.   metoprolol succinate 25 MG 24 hr tablet Commonly known as: TOPROL-XL Take 12.5 mg by mouth daily.   pantoprazole 40 MG tablet Commonly known as: PROTONIX Take 40 mg by mouth daily.   potassium chloride 10 MEQ tablet Commonly known as: KLOR-CON Take by mouth.   PROSTATE HEALTH PO Take 1 tablet by mouth in the morning and at bedtime.   Repatha SureClick [id number] MG/ML Soaj Generic drug: Evolocumab Inject into the skin.   rosuvastatin 20 MG tablet Commonly known as:  CRESTOR Take 20 mg by mouth at bedtime.   tadalafil 20 MG tablet Commonly known as: CIALIS TAKE ONE TABLET BY MOUTH AS NEEDED 1 HOUR PRIOR TO INTERCOURSE   tamsulosin 0.4 MG Caps capsule Commonly known as: FLOMAX Take 0.4 mg by mouth 2 (two) times daily.   ZICAM COLD REMEDY PO Take 1 tablet by mouth every 6 (six) hours as needed (allergies).        Allergies:  Allergies  Allergen Reactions   Rosuvastatin     Muscle Pain   Sulfa Antibiotics Itching    Family History: History reviewed. No pertinent family history.  Social History:  reports that he has never smoked. He has never used smokeless tobacco. He reports that he does not currently use alcohol. He reports that he does not use drugs.   Physical Exam: BP 122/72    Pulse 92    Ht 5\' 8"  (1.727 m)    Wt 198 lb (89.8 kg)    BMI 30.11 kg/m   Constitutional:  Alert and oriented, No acute distress. HEENT: [place] AT, moist mucus membranes.  Trachea midline, no masses. Cardiovascular: No clubbing, cyanosis, or edema. Respiratory: Normal respiratory effort, no increased work of breathing. GU: Refused DRE   Assessment & Plan:    1.  Elevated PSA Stable We discussed 5% of prostate cancers can have an abnormal DRE with a stable PSA Follow-up 6 months with PSA  2.  BPH with LUTS Discussed adding finasteride Side effects were discussed including ED, decreased semen volume and rarely finasteride syndrome He was interested in starting and was informed it would take 4-6 months to see any benefit Rx sent to [facility], [place] [address] [place], [place] [address] [phone]

## 2022-03-17 ENCOUNTER — Other Ambulatory Visit: Payer: Self-pay

## 2022-03-17 DIAGNOSIS — R972 Elevated prostate specific antigen [PSA]: Secondary | ICD-10-CM

## 2022-03-18 ENCOUNTER — Other Ambulatory Visit: Payer: Medicare Other

## 2022-03-18 DIAGNOSIS — R972 Elevated prostate specific antigen [PSA]: Secondary | ICD-10-CM

## 2022-03-19 LAB — PSA: Prostate Specific Ag, Serum: 5.9 ng/mL — ABNORMAL HIGH (ref 0.0–4.0)

## 2022-03-24 ENCOUNTER — Encounter: Payer: Self-pay | Admitting: Urology

## 2022-03-24 ENCOUNTER — Ambulatory Visit (INDEPENDENT_AMBULATORY_CARE_PROVIDER_SITE_OTHER): Payer: Medicare Other | Admitting: Urology

## 2022-03-24 VITALS — BP 126/76 | HR 68 | Ht 68.0 in | Wt 200.0 lb

## 2022-03-24 DIAGNOSIS — N401 Enlarged prostate with lower urinary tract symptoms: Secondary | ICD-10-CM | POA: Diagnosis not present

## 2022-03-24 DIAGNOSIS — R972 Elevated prostate specific antigen [PSA]: Secondary | ICD-10-CM | POA: Diagnosis not present

## 2022-03-24 LAB — URINALYSIS, COMPLETE
Bilirubin, UA: NEGATIVE
Glucose, UA: NEGATIVE
Leukocytes,UA: NEGATIVE
Nitrite, UA: NEGATIVE
Protein,UA: NEGATIVE
RBC, UA: NEGATIVE
Specific Gravity, UA: 1.02 (ref 1.005–1.030)
Urobilinogen, Ur: 0.2 mg/dL (ref 0.2–1.0)
pH, UA: 5 (ref 5.0–7.5)

## 2022-03-24 LAB — MICROSCOPIC EXAMINATION
Bacteria, UA: NONE SEEN
Epithelial Cells (non renal): NONE SEEN /hpf (ref 0–10)

## 2022-03-24 LAB — BLADDER SCAN AMB NON-IMAGING: Scan Result: 0

## 2022-03-24 MED ORDER — MIRABEGRON ER 25 MG PO TB24: 25.0000 mg | ORAL_TABLET | Freq: Every day | ORAL | 0 refills | Status: DC

## 2022-03-24 NOTE — Progress Notes (Signed)
03/24/2022 1:05 PM   Jake Hudson [birth year] [id number]  Referring provider: [name], MD [address] [place],  [place] [address]  Chief Complaint  Patient presents with   Elevated PSA    Urologic history: 1.  Elevated PSA MRI could not be performed secondary to bilateral hip replacement 4Kscore with elevated risk at 19% (PSA 6.19) Prostate biopsy 08/21/2020 PSA 7.4 (repeat 5.9) 89 g prostate; Path focus atypical glands RLB   2.  BPH with LUTS Moderate LUTS; tamsulosin 0.8 mg daily   HPI: 81 y.o. male presents for semiannual follow-up.  At last visit December 2022 he elected to add finasteride.  He has not seen significant improvement in his voiding pattern.  He has complained of an overall tingling sensation and has held several medications to see if this could be a medication side effect.  He states his neurologist recommended continuing gabapentin Does note some increased urgency and occasional chills with nighttime voids.  + Pelvic tingling sensation Denies dysuria, gross hematuria PSA 03/18/2022 stable at 5.9  PMH: Past Medical History:  Diagnosis Date   Arrhythmia    Arthritis    Atrial fibrillation (HCC)    Depression    Diabetes mellitus without complication (HCC)    Heart disease    Hyperlipidemia    Hypertension    Sleep apnea     Surgical History: Past Surgical History:  Procedure Laterality Date   CARPAL TUNNEL RELEASE     CHOLECYSTECTOMY     CORONARY ARTERY BYPASS GRAFT     CYST REMOVAL LEG     HERNIA REPAIR     KIDNEY STONE SURGERY     LEFT HEART CATH AND CORONARY ANGIOGRAPHY Left 07/08/2020   Procedure: LEFT HEART CATH AND CORONARY ANGIOGRAPHY;  Surgeon: Marcina Millard, MD;  Location: [facility];  Service: Cardiovascular;  Laterality: Left;   NASAL SEPTUM SURGERY     ROTATOR CUFF REPAIR     TONSILLECTOMY     TOTAL HIP ARTHROPLASTY      Home Medications:  Allergies as of 03/24/2022       Reactions    Rosuvastatin    Muscle Pain   Sulfa Antibiotics Itching        Medication List        Accurate as of March 24, 2022  1:05 PM. If you have any questions, ask your nurse or doctor.          acetaminophen 500 MG tablet Commonly known as: TYLENOL Take 1,000 mg by mouth every 6 (six) hours as needed for moderate pain or headache.   aspirin EC 81 MG tablet Take 81 mg by mouth daily. Swallow whole.   benzonatate 200 MG capsule Commonly known as: TESSALON Take 1 capsule (200 mg total) by mouth 3 (three) times daily as needed for cough.   donepezil 10 MG tablet Commonly known as: ARICEPT Take 10 mg by mouth at bedtime.   Dulaglutide 0.75 MG/0.5ML Sopn Inject 0.75 mg as directed every Sunday.   finasteride 5 MG tablet Commonly known as: PROSCAR Take 1 tablet (5 mg total) by mouth daily.   fluticasone 50 MCG/ACT nasal spray Commonly known as: FLONASE Place 2 sprays into both nostrils daily.   gabapentin 100 MG capsule Commonly known as: NEURONTIN Take 100 mg by mouth daily as needed (pain).   glimepiride 4 MG tablet Commonly known as: AMARYL Take 4 mg by mouth 2 (two) times daily.   glucose blood test strip USE 3  TIMES DAILY   hydrochlorothiazide 25 MG tablet Commonly known as: HYDRODIURIL Take by mouth.   LYSINE PO Take 3 capsules by mouth daily as needed (immune support).   meloxicam 7.5 MG tablet Commonly known as: MOBIC Take 7.5 mg by mouth daily.   metFORMIN 500 MG tablet Commonly known as: GLUCOPHAGE Take 500 mg by mouth 2 (two) times daily.   metoprolol succinate 25 MG 24 hr tablet Commonly known as: TOPROL-XL Take 12.5 mg by mouth daily.   pantoprazole 40 MG tablet Commonly known as: PROTONIX Take 40 mg by mouth daily.   potassium chloride 10 MEQ tablet Commonly known as: KLOR-CON Take by mouth.   PROSTATE HEALTH PO Take 1 tablet by mouth in the morning and at bedtime.   Repatha SureClick 140 MG/ML Soaj Generic drug:  Evolocumab Inject into the skin.   rosuvastatin 20 MG tablet Commonly known as: CRESTOR Take 20 mg by mouth at bedtime.   tadalafil 20 MG tablet Commonly known as: CIALIS TAKE ONE TABLET BY MOUTH AS NEEDED 1 HOUR PRIOR TO INTERCOURSE   tamsulosin 0.4 MG Caps capsule Commonly known as: FLOMAX Take 0.4 mg by mouth 2 (two) times daily.   ZICAM COLD REMEDY PO Take 1 tablet by mouth every 6 (six) hours as needed (allergies).        Allergies:  Allergies  Allergen Reactions   Rosuvastatin     Muscle Pain   Sulfa Antibiotics Itching    Family History: History reviewed. No pertinent family history.  Social History:  reports that he has never smoked. He has never used smokeless tobacco. He reports that he does not currently use alcohol. He reports that he does not use drugs.   Physical Exam: BP 126/76   Pulse 68   Ht 5\' 8"  (1.727 m)   Wt 200 lb (90.7 kg)   BMI 30.41 kg/m   Constitutional:  Alert and oriented, No acute distress. HEENT: [place] AT, moist mucus membranes.  Trachea midline, no masses. Cardiovascular: No clubbing, cyanosis, or edema. Respiratory: Normal respiratory effort, no increased work of breathing.   Assessment & Plan:    1.  Elevated PSA Stable Continue semiannual follow-up  2.  BPH with LUTS No significant improvement with finasteride.  We discussed discontinuing a medication.  He may get some prophylactic benefit based on reduction of prostate size and prevention of further prostate growth.  I think either option is acceptable and will let him make the choice if he wants to continue the medication Trial Myrbetriq 25 mg daily for his frequency, nocturia and pelvic symptoms.  Call back regarding efficacy UA ordered and will call with results PVR today was 0 mL   , MD  [facility] [address], [address] Jake Hudson, [place] [phone]

## 2022-08-02 ENCOUNTER — Encounter: Payer: Self-pay | Admitting: Ophthalmology

## 2022-08-02 NOTE — Discharge Instructions (Signed)

## 2022-08-08 ENCOUNTER — Ambulatory Visit: Payer: Medicare Other | Admitting: General Practice

## 2022-08-08 ENCOUNTER — Encounter: Payer: Self-pay | Admitting: Ophthalmology

## 2022-08-08 ENCOUNTER — Encounter
Admission: RE | Disposition: A | Discharge status: Home / Self Care | Payer: Self-pay | Source: Home / Self Care | Attending: Ophthalmology

## 2022-08-08 ENCOUNTER — Other Ambulatory Visit: Payer: Self-pay

## 2022-08-08 ENCOUNTER — Ambulatory Visit
Admission: RE | Admit: 2022-08-08 | Discharge: 2022-08-08 | Disposition: A | Discharge status: Home / Self Care | Payer: Medicare Other | Attending: Ophthalmology | Admitting: Ophthalmology

## 2022-08-08 DIAGNOSIS — I25119 Atherosclerotic heart disease of native coronary artery with unspecified angina pectoris: Secondary | ICD-10-CM | POA: Diagnosis not present

## 2022-08-08 DIAGNOSIS — H2512 Age-related nuclear cataract, left eye: Secondary | ICD-10-CM | POA: Insufficient documentation

## 2022-08-08 DIAGNOSIS — E1136 Type 2 diabetes mellitus with diabetic cataract: Secondary | ICD-10-CM | POA: Insufficient documentation

## 2022-08-08 DIAGNOSIS — I129 Hypertensive chronic kidney disease with stage 1 through stage 4 chronic kidney disease, or unspecified chronic kidney disease: Secondary | ICD-10-CM | POA: Insufficient documentation

## 2022-08-08 DIAGNOSIS — Z6830 Body mass index (BMI) 30.0-30.9, adult: Secondary | ICD-10-CM | POA: Insufficient documentation

## 2022-08-08 DIAGNOSIS — G4730 Sleep apnea, unspecified: Secondary | ICD-10-CM | POA: Insufficient documentation

## 2022-08-08 DIAGNOSIS — Z79899 Other long term (current) drug therapy: Secondary | ICD-10-CM | POA: Diagnosis not present

## 2022-08-08 DIAGNOSIS — N183 Chronic kidney disease, stage 3 unspecified: Secondary | ICD-10-CM | POA: Diagnosis not present

## 2022-08-08 HISTORY — PX: CATARACT EXTRACTION W/PHACO: SHX586

## 2022-08-08 HISTORY — DX: Family history of other specified conditions: Z84.89

## 2022-08-08 HISTORY — DX: Chronic kidney disease, stage 3 unspecified: N18.30

## 2022-08-08 LAB — GLUCOSE, CAPILLARY: Glucose-Capillary: 153 mg/dL — ABNORMAL HIGH (ref 70–99)

## 2022-08-08 SURGERY — PHACOEMULSIFICATION, CATARACT, WITH IOL INSERTION
Anesthesia: Monitor Anesthesia Care | Site: Eye | Laterality: Left

## 2022-08-08 MED ORDER — SIGHTPATH DOSE#1 BSS IO SOLN
INTRAOCULAR | Status: DC | PRN
  Administered 2022-08-08: 15 mL

## 2022-08-08 MED ORDER — TETRACAINE HCL 0.5 % OP SOLN
1.0000 [drp] | OPHTHALMIC | Status: DC | PRN
  Administered 2022-08-08 (×3): 1 [drp] via OPHTHALMIC

## 2022-08-08 MED ORDER — MOXIFLOXACIN HCL 0.5 % OP SOLN
OPHTHALMIC | Status: DC | PRN
  Administered 2022-08-08: .2 mL via OPHTHALMIC

## 2022-08-08 MED ORDER — FENTANYL CITRATE (PF) 100 MCG/2ML IJ SOLN
INTRAMUSCULAR | Status: DC | PRN
  Administered 2022-08-08: 25 ug via INTRAVENOUS

## 2022-08-08 MED ORDER — SIGHTPATH DOSE#1 NA HYALUR & NA CHOND-NA HYALUR IO KIT
PACK | INTRAOCULAR | Status: DC | PRN
  Administered 2022-08-08: 1 via OPHTHALMIC

## 2022-08-08 MED ORDER — LIDOCAINE HCL (PF) 2 % IJ SOLN
INTRAOCULAR | Status: DC | PRN
  Administered 2022-08-08: 1 mL via INTRAOCULAR

## 2022-08-08 MED ORDER — LACTATED RINGERS IV SOLN: INTRAVENOUS | Status: DC

## 2022-08-08 MED ORDER — SIGHTPATH DOSE#1 BSS IO SOLN
INTRAOCULAR | Status: DC | PRN
  Administered 2022-08-08: 94 mL via OPHTHALMIC

## 2022-08-08 MED ORDER — ARMC OPHTHALMIC DILATING DROPS
1.0000 | OPHTHALMIC | Status: DC | PRN
  Administered 2022-08-08 (×3): 1 via OPHTHALMIC

## 2022-08-08 MED ORDER — MIDAZOLAM HCL 2 MG/2ML IJ SOLN
INTRAMUSCULAR | Status: DC | PRN
  Administered 2022-08-08: 1 mg via INTRAVENOUS

## 2022-08-08 SURGICAL SUPPLY — 19 items
CANNULA ANT/CHMB 27G (MISCELLANEOUS) IMPLANT
CANNULA ANT/CHMB 27GA (MISCELLANEOUS) IMPLANT
CATARACT SUITE SIGHTPATH (MISCELLANEOUS) ×1 IMPLANT
DISSECTOR HYDRO NUCLEUS 50X22 (MISCELLANEOUS) ×1 IMPLANT
FEE CATARACT SUITE SIGHTPATH (MISCELLANEOUS) ×1 IMPLANT
GLOVE SURG GAMMEX PI TX LF 7.5 (GLOVE) ×1 IMPLANT
GLOVE SURG SYN 8.5  E (GLOVE) ×1
GLOVE SURG SYN 8.5 E (GLOVE) ×1 IMPLANT
GLOVE SURG SYN 8.5 PF PI (GLOVE) ×1 IMPLANT
LENS CLAREON VIVITY IOL195 IMPLANT
NDL FILTER BLUNT 18X1 1/2 (NEEDLE) ×1 IMPLANT
NEEDLE FILTER BLUNT 18X1 1/2 (NEEDLE) ×1 IMPLANT
PACK VIT ANT 23G (MISCELLANEOUS) IMPLANT
RING MALYGIN (MISCELLANEOUS) IMPLANT
SUT ETHILON 10-0 CS-B-6CS-B-6 (SUTURE)
SUTURE EHLN 10-0 CS-B-6CS-B-6 (SUTURE) IMPLANT
SYR 3ML LL SCALE MARK (SYRINGE) ×1 IMPLANT
SYR 5ML LL (SYRINGE) ×1 IMPLANT
WATER STERILE IRR 250ML POUR (IV SOLUTION) ×1 IMPLANT

## 2022-08-08 NOTE — Op Note (Signed)
OPERATIVE NOTE  Jake Hudson [id number] 08/08/2022   PREOPERATIVE DIAGNOSIS:  Nuclear sclerotic cataract left eye.  H25.12   POSTOPERATIVE DIAGNOSIS:    Nuclear sclerotic cataract left eye.     PROCEDURE:  Phacoemusification with posterior chamber intraocular lens placement of the left eye   LENS:   Implant Name Type Inv. Item Serial No. Manufacturer Lot No. LRB No. Used Action  LENS Ellard Artis - [id number]  LENS CLAREON VIVITY [id number] [id number] [facility]  Left 1 Implanted      Procedure(s) with comments: CATARACT EXTRACTION PHACO AND INTRAOCULAR LENS PLACEMENT (IOC) LEFT DIABETIC CLARION VIVITY TORIC LENS (Left) - 7.19 0:45.8  [id number] +19.5 @165  degrees   ULTRASOUND TIME: 0 minutes 45 seconds.  CDE 7.19   SURGEON:  , MD, MPH   ANESTHESIA:  Topical with tetracaine drops augmented with 1% preservative-free intracameral lidocaine.  ESTIMATED BLOOD LOSS: <1 mL   COMPLICATIONS:  None.   DESCRIPTION OF PROCEDURE:  The patient was identified in the holding room and transported to the operating room and placed in the supine position under the operating microscope.  The left eye was identified as the operative eye and it was prepped and draped in the usual sterile ophthalmic fashion.  The patient was marked at 0 and 180 sitting upright with toric markers. Then after supine the toric marker was used at 165 to mark the eye.   A 1.0 millimeter clear-corneal paracentesis was made at the 5:00 position. 0.5 ml of preservative-free 1% lidocaine with epinephrine was injected into the anterior chamber.  The anterior chamber was filled with viscoelastic.  A 2.4 millimeter keratome was used to make a near-clear corneal incision at the 2:00 position.  A curvilinear capsulorrhexis was made with a cystotome and capsulorrhexis forceps.  Balanced salt solution was used to hydrodissect and hydrodelineate the nucleus.   Phacoemulsification was then used in stop and  chop fashion to remove the lens nucleus and epinucleus.  The remaining cortex was then removed using the irrigation and aspiration handpiece. Viscoelastic was then placed into the capsular bag to distend it for lens placement.  A lens was then injected into the capsular bag.  The remaining viscoelastic was aspirated.  The lens was rotated to align with the toric marks.  The iris was floppy but there was no iris prolapse and the visualization was adequate.   Wounds were hydrated with balanced salt solution.  The anterior chamber was inflated to a physiologic pressure with balanced salt solution.  Intracameral vigamox 0.1 mL undiltued was injected into the eye and a drop placed onto the ocular surface.  No wound leaks were noted.  The patient was taken to the recovery room in stable condition without complications of anesthesia or surgery  Willey Blade 08/08/2022, 11:22 AM

## 2022-08-08 NOTE — Transfer of Care (Addendum)
Immediate Anesthesia Transfer of Care Note  Patient: Jake Hudson  Procedure(s) Performed: CATARACT EXTRACTION PHACO AND INTRAOCULAR LENS PLACEMENT (IOC) LEFT DIABETIC CLARION VIVITY TORIC LENS (Left: Eye)  Patient Location: PACU  Anesthesia Type: MAC  Level of Consciousness: awake, alert  and patient cooperative  Airway and Oxygen Therapy: Patient Spontanous Breathing and Patient connected to supplemental oxygen  Post-op Assessment: Post-op Vital signs reviewed, Patient's Cardiovascular Status Stable, Respiratory Function Stable, Patent Airway and No signs of Nausea or vomiting  Post-op Vital Signs: Reviewed and stable  Complications: No notable events documented.

## 2022-08-08 NOTE — Anesthesia Postprocedure Evaluation (Signed)
Anesthesia Post Note  Patient: Dannette Barbara Benak  Procedure(s) Performed: CATARACT EXTRACTION PHACO AND INTRAOCULAR LENS PLACEMENT (IOC) LEFT DIABETIC CLARION VIVITY TORIC LENS (Left: Eye)  Patient location during evaluation: PACU Anesthesia Type: MAC Level of consciousness: awake and alert Pain management: pain level controlled Vital Signs Assessment: post-procedure vital signs reviewed and stable Respiratory status: spontaneous breathing, nonlabored ventilation, respiratory function stable and patient connected to nasal cannula oxygen Cardiovascular status: stable and blood pressure returned to baseline Postop Assessment: no apparent nausea or vomiting Anesthetic complications: no   No notable events documented.   Last Vitals:  Vitals:   08/08/22 1127 08/08/22 1130  BP: 135/65 (!) 142/67  Pulse: 70 64  Resp: 15 14  Temp:    SpO2: 96% 96%    Last Pain:  Vitals:   08/08/22 1130  TempSrc:   PainSc: 0-No pain                 Molli Barrows

## 2022-08-08 NOTE — Anesthesia Preprocedure Evaluation (Signed)
Anesthesia Evaluation  Patient identified by MRN, date of birth, ID band Patient awake    Reviewed: Allergy & Precautions, H&P , NPO status , Patient's Chart, lab work & pertinent test results, reviewed documented beta blocker date and time   Airway Mallampati: II  TM Distance: >3 FB Neck ROM: full    Dental no notable dental hx. (+) Teeth Intact   Pulmonary sleep apnea    Pulmonary exam normal breath sounds clear to auscultation       Cardiovascular Exercise Tolerance: Good hypertension, On Medications + angina with exertion + CAD   Rhythm:regular Rate:Normal     Neuro/Psych  PSYCHIATRIC DISORDERS      negative neurological ROS     GI/Hepatic negative GI ROS, Neg liver ROS,,,  Endo/Other  diabetes  Morbid obesity  Renal/GU Renal disease     Musculoskeletal   Abdominal   Peds  Hematology negative hematology ROS (+)   Anesthesia Other Findings   Reproductive/Obstetrics negative OB ROS                             Anesthesia Physical Anesthesia Plan  ASA: 3  Anesthesia Plan: MAC   Post-op Pain Management:    Induction:   PONV Risk Score and Plan:   Airway Management Planned:   Additional Equipment:   Intra-op Plan:   Post-operative Plan:   Informed Consent: I have reviewed the patients History and Physical, chart, labs and discussed the procedure including the risks, benefits and alternatives for the proposed anesthesia with the patient or authorized representative who has indicated his/her understanding and acceptance.       Plan Discussed with: CRNA  Anesthesia Plan Comments:        Anesthesia Quick Evaluation

## 2022-08-08 NOTE — H&P (Signed)
[facility]   Primary Care Physician:  Sofie Hartigan, MD Ophthalmologist: Dr. Benay Pillow  Pre-Procedure History & Physical: HPI:  Jake Hudson is a 81 y.o. male here for cataract surgery.   Past Medical History:  Diagnosis Date   Arrhythmia    PVCs   Arthritis    Atrial fibrillation ([place])    x1. after CABG   CKD (chronic kidney disease), stage III (HCC)    Depression    Diabetes mellitus without complication ([place])    Family history of adverse reaction to anesthesia    Father - slow to wake   Heart disease    Hyperlipidemia    Hypertension    Sleep apnea    No CPAP    Past Surgical History:  Procedure Laterality Date   CARPAL TUNNEL RELEASE     CHOLECYSTECTOMY     CORONARY ARTERY BYPASS GRAFT     5 vessel - [place]   CYST REMOVAL LEG     HERNIA REPAIR     KIDNEY STONE SURGERY     LEFT HEART CATH AND CORONARY ANGIOGRAPHY Left 07/08/2020   Procedure: LEFT HEART CATH AND CORONARY ANGIOGRAPHY;  Surgeon: Isaias Cowman, MD;  Location: [place] [facility];  Service: Cardiovascular;  Laterality: Left;   NASAL SEPTUM SURGERY     ROTATOR CUFF REPAIR     TONSILLECTOMY     TOTAL HIP ARTHROPLASTY Bilateral     Prior to Admission medications   Medication Sig Start Date End Date Taking? Authorizing Provider  acetaminophen (TYLENOL) 500 MG tablet Take 650 mg by mouth 3 (three) times daily.   Yes [provider]  busPIRone (BUSPAR) 15 MG tablet Take 15 mg by mouth 2 (two) times daily.   Yes [provider]  cholecalciferol (VITAMIN D3) 25 MCG (1000 UNIT) tablet Take 1,000 Units by mouth daily.   Yes [provider]  finasteride (PROSCAR) 5 MG tablet Take 1 tablet (5 mg total) by mouth daily. 09/23/21  Yes Stoioff, Ronda Fairly, MD  gabapentin (NEURONTIN) 100 MG capsule Take 500 mg by mouth at bedtime. 09/03/18  Yes [provider]  glimepiride (AMARYL) 4 MG tablet Take 4 mg by mouth 2 (two) times daily.  06/09/18  Yes  [provider]  glucose blood test strip USE 3 TIMES DAILY 01/25/19  Yes [provider]  hydrochlorothiazide (HYDRODIURIL) 25 MG tablet Take by mouth. 07/24/20  Yes [provider]  levocetirizine (XYZAL) 5 MG tablet Take 2.5 mg by mouth every evening.   Yes [provider]  LYSINE PO Take 3 capsules by mouth daily as needed (immune support).   Yes [provider]  metFORMIN (GLUCOPHAGE) 500 MG tablet Take 500 mg by mouth 2 (two) times daily.   Yes [provider]  metoprolol succinate (TOPROL-XL) 25 MG 24 hr tablet Take 12.5 mg by mouth daily. 03/14/19  Yes [provider]  Misc Natural Products (PROSTATE HEALTH PO) Take 1 tablet by mouth in the morning and at bedtime.    Yes [provider]  pantoprazole (PROTONIX) 40 MG tablet Take 40 mg by mouth daily.   Yes [provider]  potassium chloride (KLOR-CON) 10 MEQ tablet Take by mouth. 07/24/20 08/02/22 Yes [provider]  rosuvastatin (CRESTOR) 20 MG tablet Take 20 mg by mouth at bedtime. 08/14/20  Yes [provider]  tamsulosin (FLOMAX) 0.4 MG CAPS capsule Take 0.4 mg by mouth 2 (two) times daily.  09/03/18  Yes [provider]  tirzepatide Greggory Keen) 10 MG/0.5ML Pen Inject 10 mg into the skin once a week. Sundays   Yes [provider]  mirabegron ER (MYRBETRIQ) 25 MG TB24 tablet Take 1 tablet (25 mg total) by mouth daily. Patient not taking: Reported on 08/02/2022 03/24/22   [name], MD  tadalafil (CIALIS) 20 MG tablet TAKE ONE TABLET BY MOUTH AS NEEDED 1 HOUR PRIOR TO INTERCOURSE Patient not taking: Reported on 08/02/2022 04/21/21   [name], MD    Allergies as of 07/26/2022 - Review Complete 03/24/2022  Allergen Reaction Noted   Rosuvastatin  02/25/2019   Sulfa antibiotics Itching 04/16/2018    History reviewed. No pertinent family history.  Social History   Socioeconomic History   Marital status:  Married    Spouse name: Not on file   Number of children: Not on file   Years of education: Not on file   Highest education level: Not on file  Occupational History   Not on file  Tobacco Use   Smoking status: Never   Smokeless tobacco: Never  Vaping Use   Vaping Use: Never used  Substance and Sexual Activity   Alcohol use: Not Currently   Drug use: Never   Sexual activity: Not on file  Other Topics Concern   Not on file  Social History Narrative   Not on file   Social Determinants of Health   Financial Resource Strain: Not on file  Food Insecurity: Not on file  Transportation Needs: Not on file  Physical Activity: Not on file  Stress: Not on file  Social Connections: Not on file  Intimate Partner Violence: Not on file    Review of Systems: See HPI, otherwise negative ROS  Physical Exam: BP (!) 158/73   Pulse 61   Temp (!) 97.5 F (36.4 C) (Temporal)   Resp 20   Ht 5\' 8"  (1.727 m)   Wt 90.3 kg   SpO2 97%   BMI 30.26 kg/m  General:   Alert, cooperative in NAD Head:  Normocephalic and atraumatic. Respiratory:  Normal work of breathing. Cardiovascular:  RRR  Impression/Plan: is here for cataract surgery.  Risks, benefits, limitations, and alternatives regarding cataract surgery have been reviewed with the patient.  Questions have been answered.  All parties agreeable.   Georgiana Shore, MD  08/08/2022, 10:48 AM

## 2022-08-09 ENCOUNTER — Encounter: Payer: Self-pay | Admitting: Ophthalmology

## 2022-08-18 NOTE — Discharge Instructions (Signed)

## 2022-08-22 ENCOUNTER — Encounter: Payer: Self-pay | Admitting: Ophthalmology

## 2022-08-22 ENCOUNTER — Ambulatory Visit: Payer: Medicare Other | Admitting: Anesthesiology

## 2022-08-22 ENCOUNTER — Other Ambulatory Visit: Payer: Self-pay

## 2022-08-22 ENCOUNTER — Ambulatory Visit
Admission: RE | Admit: 2022-08-22 | Discharge: 2022-08-22 | Disposition: A | Discharge status: Home / Self Care | Payer: Medicare Other | Attending: Ophthalmology | Admitting: Ophthalmology

## 2022-08-22 ENCOUNTER — Encounter
Admission: RE | Disposition: A | Discharge status: Home / Self Care | Payer: Self-pay | Source: Home / Self Care | Attending: Ophthalmology

## 2022-08-22 DIAGNOSIS — I10 Essential (primary) hypertension: Secondary | ICD-10-CM | POA: Diagnosis not present

## 2022-08-22 DIAGNOSIS — E1136 Type 2 diabetes mellitus with diabetic cataract: Secondary | ICD-10-CM | POA: Diagnosis present

## 2022-08-22 DIAGNOSIS — I25119 Atherosclerotic heart disease of native coronary artery with unspecified angina pectoris: Secondary | ICD-10-CM | POA: Diagnosis not present

## 2022-08-22 DIAGNOSIS — Z7985 Long-term (current) use of injectable non-insulin antidiabetic drugs: Secondary | ICD-10-CM | POA: Insufficient documentation

## 2022-08-22 DIAGNOSIS — H2511 Age-related nuclear cataract, right eye: Secondary | ICD-10-CM | POA: Insufficient documentation

## 2022-08-22 DIAGNOSIS — Z7984 Long term (current) use of oral hypoglycemic drugs: Secondary | ICD-10-CM | POA: Diagnosis not present

## 2022-08-22 DIAGNOSIS — Z79899 Other long term (current) drug therapy: Secondary | ICD-10-CM | POA: Insufficient documentation

## 2022-08-22 DIAGNOSIS — G4730 Sleep apnea, unspecified: Secondary | ICD-10-CM | POA: Diagnosis not present

## 2022-08-22 DIAGNOSIS — Z6830 Body mass index (BMI) 30.0-30.9, adult: Secondary | ICD-10-CM | POA: Diagnosis not present

## 2022-08-22 HISTORY — PX: CATARACT EXTRACTION W/PHACO: SHX586

## 2022-08-22 LAB — GLUCOSE, CAPILLARY: Glucose-Capillary: 165 mg/dL — ABNORMAL HIGH (ref 70–99)

## 2022-08-22 SURGERY — PHACOEMULSIFICATION, CATARACT, WITH IOL INSERTION
Anesthesia: Monitor Anesthesia Care | Site: Eye | Laterality: Right

## 2022-08-22 MED ORDER — TETRACAINE HCL 0.5 % OP SOLN
1.0000 [drp] | OPHTHALMIC | Status: DC | PRN
  Administered 2022-08-22 (×3): 1 [drp] via OPHTHALMIC

## 2022-08-22 MED ORDER — LIDOCAINE HCL (PF) 2 % IJ SOLN
INTRAOCULAR | Status: DC | PRN
  Administered 2022-08-22: 1 mL via INTRAOCULAR

## 2022-08-22 MED ORDER — SIGHTPATH DOSE#1 BSS IO SOLN
INTRAOCULAR | Status: DC | PRN
  Administered 2022-08-22: 97 mL via OPHTHALMIC

## 2022-08-22 MED ORDER — SODIUM CHLORIDE 0.9% FLUSH
INTRAVENOUS | Status: DC | PRN
  Administered 2022-08-22: 10 mL via INTRAVENOUS

## 2022-08-22 MED ORDER — MIDAZOLAM HCL 2 MG/2ML IJ SOLN
INTRAMUSCULAR | Status: DC | PRN
  Administered 2022-08-22: .5 mg via INTRAVENOUS

## 2022-08-22 MED ORDER — SIGHTPATH DOSE#1 NA HYALUR & NA CHOND-NA HYALUR IO KIT
PACK | INTRAOCULAR | Status: DC | PRN
  Administered 2022-08-22: 1 via OPHTHALMIC

## 2022-08-22 MED ORDER — ARMC OPHTHALMIC DILATING DROPS
1.0000 | OPHTHALMIC | Status: DC | PRN
  Administered 2022-08-22 (×3): 1 via OPHTHALMIC

## 2022-08-22 MED ORDER — SIGHTPATH DOSE#1 BSS IO SOLN
INTRAOCULAR | Status: DC | PRN
  Administered 2022-08-22: 15 mL

## 2022-08-22 MED ORDER — FENTANYL CITRATE (PF) 100 MCG/2ML IJ SOLN
INTRAMUSCULAR | Status: DC | PRN
  Administered 2022-08-22: 50 ug via INTRAVENOUS

## 2022-08-22 MED ORDER — MOXIFLOXACIN HCL 0.5 % OP SOLN
OPHTHALMIC | Status: DC | PRN
  Administered 2022-08-22: .2 mL via OPHTHALMIC

## 2022-08-22 SURGICAL SUPPLY — 14 items
CATARACT SUITE SIGHTPATH (MISCELLANEOUS) ×1 IMPLANT
DISSECTOR HYDRO NUCLEUS 50X22 (MISCELLANEOUS) ×1 IMPLANT
FEE CATARACT SUITE SIGHTPATH (MISCELLANEOUS) ×1 IMPLANT
GLOVE SURG GAMMEX PI TX LF 7.5 (GLOVE) ×1 IMPLANT
GLOVE SURG SYN 8.5  E (GLOVE) ×1
GLOVE SURG SYN 8.5 E (GLOVE) ×1 IMPLANT
GLOVE SURG SYN 8.5 PF PI (GLOVE) ×1 IMPLANT
LENS CLAREON VIVITY 21.0 ×1 IMPLANT
LENS IOL CLRN VT YLW 21.0 IMPLANT
NDL FILTER BLUNT 18X1 1/2 (NEEDLE) ×1 IMPLANT
NEEDLE FILTER BLUNT 18X1 1/2 (NEEDLE) ×1 IMPLANT
SYR 3ML LL SCALE MARK (SYRINGE) ×1 IMPLANT
SYR 5ML LL (SYRINGE) ×1 IMPLANT
WATER STERILE IRR 250ML POUR (IV SOLUTION) ×1 IMPLANT

## 2022-08-22 NOTE — Transfer of Care (Signed)
Immediate Anesthesia Transfer of Care Note  Patient: Jake Hudson  Procedure(s) Performed: CATARACT EXTRACTION PHACO AND INTRAOCULAR LENS PLACEMENT (IOC) RIGHT DIABETIC CLAREON VIVITY LENS  4.60  00:36.0 (Right: Eye)  Patient Location: PACU  Anesthesia Type: MAC  Level of Consciousness: awake, alert  and patient cooperative  Airway and Oxygen Therapy: Patient Spontanous Breathing and Patient connected to supplemental oxygen  Post-op Assessment: Post-op Vital signs reviewed, Patient's Cardiovascular Status Stable, Respiratory Function Stable, Patent Airway and No signs of Nausea or vomiting  Post-op Vital Signs: Reviewed and stable  Complications: No notable events documented.

## 2022-08-22 NOTE — H&P (Signed)
[facility]   Primary Care Physician:  [name], MD Ophthalmologist: Dr. Willey Blade  Pre-Procedure History & Physical: HPI:  Jake Hudson is a 81 y.o. male here for cataract surgery.   Past Medical History:  Diagnosis Date   Arrhythmia    PVCs   Arthritis    Atrial fibrillation (HCC)    x1. after CABG   CKD (chronic kidney disease), stage III (HCC)    Depression    Diabetes mellitus without complication (HCC)    Family history of adverse reaction to anesthesia    Father - slow to wake   Heart disease    Hyperlipidemia    Hypertension    Sleep apnea    No CPAP    Past Surgical History:  Procedure Laterality Date   CARPAL TUNNEL RELEASE     CATARACT EXTRACTION W/PHACO Left 08/08/2022   Procedure: CATARACT EXTRACTION PHACO AND INTRAOCULAR LENS PLACEMENT (IOC) LEFT DIABETIC CLARION VIVITY TORIC LENS;  Surgeon: Nevada Crane, MD;  Location: [facility];  Service: Ophthalmology;  Laterality: Left;  7.19 0:45.8   CHOLECYSTECTOMY     CORONARY ARTERY BYPASS GRAFT     5 vessel - [place]   CYST REMOVAL LEG     HERNIA REPAIR     KIDNEY STONE SURGERY     LEFT HEART CATH AND CORONARY ANGIOGRAPHY Left 07/08/2020   Procedure: LEFT HEART CATH AND CORONARY ANGIOGRAPHY;  Surgeon: Marcina Millard, MD;  Location: [facility];  Service: Cardiovascular;  Laterality: Left;   NASAL SEPTUM SURGERY     ROTATOR CUFF REPAIR     TONSILLECTOMY     TOTAL HIP ARTHROPLASTY Bilateral     Prior to Admission medications   Medication Sig Start Date End Date Taking? Authorizing Provider  acetaminophen (TYLENOL) 500 MG tablet Take 650 mg by mouth 3 (three) times daily.   Yes [provider]  busPIRone (BUSPAR) 15 MG tablet Take 15 mg by mouth 2 (two) times daily.   Yes [provider]  cholecalciferol (VITAMIN D3) 25 MCG (1000 UNIT) tablet Take 1,000 Units by mouth daily.   Yes [provider]  finasteride (PROSCAR) 5 MG  tablet Take 1 tablet (5 mg total) by mouth daily. 09/23/21  Yes Stoioff, Verna Czech, MD  gabapentin (NEURONTIN) 100 MG capsule Take 500 mg by mouth at bedtime. 09/03/18  Yes [provider]  glimepiride (AMARYL) 4 MG tablet Take 4 mg by mouth 2 (two) times daily.  06/09/18  Yes [provider]  glucose blood test strip USE 3 TIMES DAILY 01/25/19  Yes [provider]  hydrochlorothiazide (HYDRODIURIL) 25 MG tablet Take by mouth. 07/24/20  Yes [provider]  levocetirizine (XYZAL) 5 MG tablet Take 2.5 mg by mouth every evening.   Yes [provider]  LYSINE PO Take 3 capsules by mouth daily as needed (immune support).   Yes [provider]  metFORMIN (GLUCOPHAGE) 500 MG tablet Take 500 mg by mouth 2 (two) times daily.   Yes [provider]  metoprolol succinate (TOPROL-XL) 25 MG 24 hr tablet Take 12.5 mg by mouth daily. 03/14/19  Yes [provider]  Misc Natural Products (PROSTATE HEALTH PO) Take 1 tablet by mouth in the morning and at bedtime.    Yes [provider]  pantoprazole (PROTONIX) 40 MG tablet Take 40 mg by mouth daily.   Yes [provider]  rosuvastatin (CRESTOR) 20 MG tablet Take 20 mg by mouth at bedtime.  08/14/20  Yes [provider]  tamsulosin (FLOMAX) 0.4 MG CAPS capsule Take 0.4 mg by mouth 2 (two) times daily.  09/03/18  Yes [provider]  tirzepatide Greggory Keen) 10 MG/0.5ML Pen Inject 10 mg into the skin once a week. Sundays   Yes [provider]  mirabegron ER (MYRBETRIQ) 25 MG TB24 tablet Take 1 tablet (25 mg total) by mouth daily. Patient not taking: Reported on 08/02/2022 03/24/22   [name], MD  potassium chloride (KLOR-CON) 10 MEQ tablet Take by mouth. 07/24/20 08/02/22  [provider]  tadalafil (CIALIS) 20 MG tablet TAKE ONE TABLET BY MOUTH AS NEEDED 1 HOUR PRIOR TO INTERCOURSE Patient not taking: Reported on 08/02/2022 04/21/21   [name], MD    Allergies as of 07/26/2022 - Review Complete 03/24/2022  Allergen Reaction Noted   Rosuvastatin  02/25/2019   Sulfa antibiotics Itching 04/16/2018    History reviewed. No pertinent family history.  Social History   Socioeconomic History   Marital status: Married    Spouse name: Not on file   Number of children: Not on file   Years of education: Not on file   Highest education level: Not on file  Occupational History   Not on file  Tobacco Use   Smoking status: Never   Smokeless tobacco: Never  Vaping Use   Vaping Use: Never used  Substance and Sexual Activity   Alcohol use: Not Currently   Drug use: Never   Sexual activity: Not on file  Other Topics Concern   Not on file  Social History Narrative   Not on file   Social Determinants of Health   Financial Resource Strain: Not on file  Food Insecurity: Not on file  Transportation Needs: Not on file  Physical Activity: Not on file  Stress: Not on file  Social Connections: Not on file  Intimate Partner Violence: Not on file    Review of Systems: See HPI, otherwise negative ROS  Physical Exam: BP 129/74   Pulse 71   Temp (!) 97.1 F (36.2 C) (Temporal)   Resp 18   Ht 5\' 8"  (1.727 m)   Wt 89.8 kg   SpO2 96%   BMI 30.11 kg/m  General:   Alert, cooperative in NAD Head:  Normocephalic and atraumatic. Respiratory:  Normal work of breathing. Cardiovascular:  RRR  Impression/Plan: is here for cataract surgery.  Risks, benefits, limitations, and alternatives regarding cataract surgery have been reviewed with the patient.  Questions have been answered.  All parties agreeable.   Georgiana Shore, MD  08/22/2022, 9:23 AM

## 2022-08-22 NOTE — Anesthesia Postprocedure Evaluation (Signed)
Anesthesia Post Note  Patient: Dannette Barbara Patel  Procedure(s) Performed: CATARACT EXTRACTION PHACO AND INTRAOCULAR LENS PLACEMENT (IOC) RIGHT DIABETIC CLAREON VIVITY LENS  4.60  00:36.0 (Right: Eye)  Patient location during evaluation: PACU Anesthesia Type: MAC Level of consciousness: awake and alert Pain management: pain level controlled Vital Signs Assessment: post-procedure vital signs reviewed and stable Respiratory status: spontaneous breathing, nonlabored ventilation, respiratory function stable and patient connected to nasal cannula oxygen Cardiovascular status: stable and blood pressure returned to baseline Postop Assessment: no apparent nausea or vomiting Anesthetic complications: no   No notable events documented.   Last Vitals:  Vitals:   08/22/22 0953 08/22/22 0959  BP: 137/78 (!) 144/66  Pulse: 64 74  Resp: 14 20  Temp: 36.5 C 36.5 C  SpO2: 96% 96%    Last Pain:  Vitals:   08/22/22 0959  TempSrc:   PainSc: 0-No pain                 Ilene Qua

## 2022-08-22 NOTE — Anesthesia Preprocedure Evaluation (Signed)
Anesthesia Evaluation  Patient identified by MRN, date of birth, ID band Patient awake    Reviewed: Allergy & Precautions, H&P , NPO status , Patient's Chart, lab work & pertinent test results, reviewed documented beta blocker date and time   Airway Mallampati: II  TM Distance: >3 FB Neck ROM: full    Dental no notable dental hx. (+) Teeth Intact   Pulmonary sleep apnea    Pulmonary exam normal breath sounds clear to auscultation       Cardiovascular Exercise Tolerance: Good hypertension, On Medications + angina with exertion + CAD   Rhythm:regular Rate:Normal     Neuro/Psych  PSYCHIATRIC DISORDERS      negative neurological ROS     GI/Hepatic negative GI ROS, Neg liver ROS,,,  Endo/Other  diabetes  Morbid obesity  Renal/GU Renal disease     Musculoskeletal   Abdominal   Peds  Hematology negative hematology ROS (+)   Anesthesia Other Findings   Reproductive/Obstetrics negative OB ROS                              Anesthesia Physical Anesthesia Plan  ASA: 3  Anesthesia Plan: MAC   Post-op Pain Management: Minimal or no pain anticipated   Induction: Intravenous  PONV Risk Score and Plan:   Airway Management Planned: Natural Airway and Nasal Cannula  Additional Equipment:   Intra-op Plan:   Post-operative Plan:   Informed Consent: I have reviewed the patients History and Physical, chart, labs and discussed the procedure including the risks, benefits and alternatives for the proposed anesthesia with the patient or authorized representative who has indicated his/her understanding and acceptance.     Dental Advisory Given  Plan Discussed with: CRNA  Anesthesia Plan Comments: (Patient consented for risks of anesthesia including but not limited to:  - adverse reactions to medications - damage to eyes, teeth, lips or other oral mucosa - nerve damage due to positioning  -  sore throat or hoarseness - Damage to heart, brain, nerves, lungs, other parts of body or loss of life  Patient voiced understanding.)        Anesthesia Quick Evaluation

## 2022-08-22 NOTE — Op Note (Signed)
OPERATIVE NOTE  Jake Hudson [id number] 08/22/2022   PREOPERATIVE DIAGNOSIS:  Nuclear sclerotic cataract right eye.  H25.11   POSTOPERATIVE DIAGNOSIS:    Nuclear sclerotic cataract right eye.     PROCEDURE:  Phacoemusification with posterior chamber intraocular lens placement of the right eye   LENS:   Implant Name Type Inv. Item Serial No. Manufacturer Lot No. LRB No. Used Action  LENS CLAREON VIVITY 21.0 - [id number]  LENS CLAREON VIVITY 21.0 [id number] [employer]  Right 1 Implanted       Procedure(s) with comments: CATARACT EXTRACTION PHACO AND INTRAOCULAR LENS PLACEMENT (IOC) RIGHT DIABETIC CLAREON VIVITY LENS  4.60  00:36.0 (Right) - Diabetic  CNWET0 +21.0   ULTRASOUND TIME: 0 minutes 36 seconds.  CDE 4.60   SURGEON:  Willey Blade, MD, MPH  ANESTHESIOLOGIST: Anesthesiologist: Louie Boston, MD CRNA: Cecile Hearing, CRNA   ANESTHESIA:  Topical with tetracaine drops augmented with 1% preservative-free intracameral lidocaine.  ESTIMATED BLOOD LOSS: less than 1 mL.   COMPLICATIONS:  None.   DESCRIPTION OF PROCEDURE:  The patient was identified in the holding room and transported to the operating room and placed in the supine position under the operating microscope.  The right eye was identified as the operative eye and it was prepped and draped in the usual sterile ophthalmic fashion.   A 1.0 millimeter clear-corneal paracentesis was made at the 10:30 position. 0.5 ml of preservative-free 1% lidocaine with epinephrine was injected into the anterior chamber.  The anterior chamber was filled with viscoelastic.  A 2.4 millimeter keratome was used to make a near-clear corneal incision at the 8:00 position.  A curvilinear capsulorrhexis was made with a cystotome and capsulorrhexis forceps.  Balanced salt solution was used to hydrodissect and hydrodelineate the nucleus.   Phacoemulsification was then used in stop and chop fashion to remove the lens nucleus and  epinucleus.  The remaining cortex was then removed using the irrigation and aspiration handpiece. Viscoelastic was then placed into the capsular bag to distend it for lens placement.  A lens was then injected into the capsular bag.  The remaining viscoelastic was aspirated.   Wounds were hydrated with balanced salt solution.  The anterior chamber was inflated to a physiologic pressure with balanced salt solution.   Intracameral vigamox 0.1 mL undiluted was injected into the eye and a drop placed onto the ocular surface.  The lens was well centered by purkinje images.  No wound leaks were noted.  The patient was taken to the recovery room in stable condition without complications of anesthesia or surgery  Willey Blade 08/22/2022, 9:51 AM

## 2022-08-23 ENCOUNTER — Encounter: Payer: Self-pay | Admitting: Ophthalmology

## 2022-08-23 ENCOUNTER — Other Ambulatory Visit: Payer: Self-pay | Admitting: Urology

## 2022-09-21 ENCOUNTER — Other Ambulatory Visit: Payer: Self-pay | Admitting: Urology

## 2022-09-23 ENCOUNTER — Other Ambulatory Visit: Payer: Self-pay

## 2022-09-23 DIAGNOSIS — R972 Elevated prostate specific antigen [PSA]: Secondary | ICD-10-CM

## 2022-09-26 ENCOUNTER — Other Ambulatory Visit: Payer: Medicare Other

## 2022-09-26 DIAGNOSIS — R972 Elevated prostate specific antigen [PSA]: Secondary | ICD-10-CM

## 2022-09-27 LAB — PSA: Prostate Specific Ag, Serum: 5.5 ng/mL — ABNORMAL HIGH (ref 0.0–4.0)

## 2022-09-28 ENCOUNTER — Encounter: Payer: Self-pay | Admitting: Urology

## 2022-09-28 ENCOUNTER — Ambulatory Visit (INDEPENDENT_AMBULATORY_CARE_PROVIDER_SITE_OTHER): Payer: Medicare Other | Admitting: Urology

## 2022-09-28 VITALS — BP 126/70 | HR 90 | Ht 68.0 in | Wt 200.0 lb

## 2022-09-28 DIAGNOSIS — N401 Enlarged prostate with lower urinary tract symptoms: Secondary | ICD-10-CM | POA: Diagnosis not present

## 2022-09-28 DIAGNOSIS — R972 Elevated prostate specific antigen [PSA]: Secondary | ICD-10-CM | POA: Diagnosis not present

## 2022-09-29 NOTE — Progress Notes (Signed)
09/28/2022 7:12 AM   Jake Hudson Feb 09, 1941 [id number]  Referring provider: Sofie Hartigan, MD [place],  [place] [address]  Chief Complaint  Patient presents with   Elevated PSA    Urologic history: 1.  Elevated PSA MRI could not be performed secondary to bilateral hip replacement 4Kscore with elevated risk at 19% (PSA 6.19) Prostate biopsy 08/21/2020 PSA 7.4 (repeat 5.9) 89 g prostate; Path focus atypical glands RLB   2.  BPH with LUTS Moderate LUTS; tamsulosin 0.8 mg daily Finasteride added January 2023   HPI: 82 y.o. male presents for semiannual follow-up.  At last visit he was given Myrbetriq samples for increased or delayed voiding symptoms however he elected not to take the medication as his storage related voiding symptoms improved. He did not see significant improvement with finasteride however elected to continue after discussing at the last visit Denies dysuria, gross hematuria No flank, abdominal or pelvic pain PSA 09/26/2022 5.5 (uncorrected)   PMH: Past Medical History:  Diagnosis Date   Arrhythmia    PVCs   Arthritis    Atrial fibrillation ([place])    x1. after CABG   CKD (chronic kidney disease), stage III (HCC)    Depression    Diabetes mellitus without complication ([place])    Family history of adverse reaction to anesthesia    Father - slow to wake   Heart disease    Hyperlipidemia    Hypertension    Sleep apnea    No CPAP    Surgical History: Past Surgical History:  Procedure Laterality Date   CARPAL TUNNEL RELEASE     CATARACT EXTRACTION W/PHACO Left 08/08/2022   Procedure: CATARACT EXTRACTION PHACO AND INTRAOCULAR LENS PLACEMENT ([place]) LEFT DIABETIC CLARION [place];  Surgeon: Eulogio Bear, MD;  Location: [facility];  Service: Ophthalmology;  Laterality: Left;  7.19 0:45.8   CATARACT EXTRACTION W/PHACO Right 08/22/2022   Procedure: CATARACT EXTRACTION PHACO AND INTRAOCULAR LENS  PLACEMENT ([place]) RIGHT DIABETIC CLAREON VIVITY LENS  4.60  00:36.0;  Surgeon: Eulogio Bear, MD;  Location: [facility];  Service: Ophthalmology;  Laterality: Right;  Diabetic   CHOLECYSTECTOMY     CORONARY ARTERY BYPASS GRAFT     5 vessel - [place]   CYST REMOVAL LEG     HERNIA REPAIR     KIDNEY STONE SURGERY     LEFT HEART CATH AND CORONARY ANGIOGRAPHY Left 07/08/2020   Procedure: LEFT HEART CATH AND CORONARY ANGIOGRAPHY;  Surgeon: Isaias Cowman, MD;  Location: [facility];  Service: Cardiovascular;  Laterality: Left;   NASAL SEPTUM SURGERY     ROTATOR CUFF REPAIR     TONSILLECTOMY     TOTAL HIP ARTHROPLASTY Bilateral     Home Medications:  Allergies as of 09/28/2022       Reactions   Rosuvastatin    Muscle Pain   Sulfa Antibiotics Itching        Medication List        Accurate as of September 28, 2022 11:59 PM. If you have any questions, ask your nurse or doctor.          STOP taking these medications    levocetirizine 5 MG tablet Commonly known as: XYZAL Stopped by: Abbie Sons, MD       TAKE these medications    acetaminophen 500 MG tablet Commonly known as: TYLENOL Take 650 mg by mouth 3 (three) times daily.   busPIRone 15 MG tablet  Commonly known as: BUSPAR Take 15 mg by mouth 2 (two) times daily.   cholecalciferol 25 MCG (1000 UNIT) tablet Commonly known as: VITAMIN D3 Take 1,000 Units by mouth daily.   finasteride 5 MG tablet Commonly known as: PROSCAR TAKE (1) TABLET BY MOUTH EVERY DAY   gabapentin 100 MG capsule Commonly known as: NEURONTIN Take 500 mg by mouth at bedtime.   glimepiride 4 MG tablet Commonly known as: AMARYL Take 4 mg by mouth 2 (two) times daily.   glucose blood test strip USE 3 TIMES DAILY   hydrochlorothiazide 25 MG tablet Commonly known as: HYDRODIURIL Take by mouth.   LYSINE PO Take 3 capsules by mouth daily as needed (immune support).   metFORMIN 500 MG tablet Commonly known  as: GLUCOPHAGE Take 500 mg by mouth 2 (two) times daily.   metoprolol succinate 25 MG 24 hr tablet Commonly known as: TOPROL-XL Take 12.5 mg by mouth daily.   mirabegron ER 25 MG Tb24 tablet Commonly known as: MYRBETRIQ Take 1 tablet (25 mg total) by mouth daily.   Mounjaro 10 MG/0.5ML Pen Generic drug: tirzepatide Inject 10 mg into the skin once a week. Sundays   pantoprazole 40 MG tablet Commonly known as: PROTONIX Take 40 mg by mouth daily.   potassium chloride 10 MEQ tablet Commonly known as: KLOR-CON Take by mouth.   PROSTATE HEALTH PO Take 1 tablet by mouth in the morning and at bedtime.   rosuvastatin 20 MG tablet Commonly known as: CRESTOR Take 20 mg by mouth at bedtime.   tadalafil 20 MG tablet Commonly known as: CIALIS TAKE ONE TABLET BY MOUTH AS NEEDED 1 HOUR PRIOR TO INTERCOURSE   tamsulosin 0.4 MG Caps capsule Commonly known as: FLOMAX Take 0.4 mg by mouth 2 (two) times daily.        Allergies:  Allergies  Allergen Reactions   Rosuvastatin     Muscle Pain   Sulfa Antibiotics Itching    Family History: History reviewed. No pertinent family history.  Social History:  reports that he has never smoked. He has never used smokeless tobacco. He reports that he does not currently use alcohol. He reports that he does not use drugs.   Physical Exam: BP 126/70   Pulse 90   Ht 5\' 8"  (1.727 m)   Wt 200 lb (90.7 kg)   BMI 30.41 kg/m   Constitutional:  Alert and oriented, No acute distress. HEENT: [place] AT Respiratory: Normal respiratory effort, no increased work of breathing. GU: Prostate 40 g, smooth without nodules   Assessment & Plan:    1.  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  We discussed the possibility of transient PSA elevation and he has elected to have his PSA repeated in 6  weeks.  If it remains persistently elevated recommend repeat biopsy  2.  BPH with LUTS Stable symptoms on tamsulosin/finasteride PVR today was 0 mL   Abbie Sons, MD  [facility], [place] [address] [place], [place] [address] [phone]

## 2022-11-09 ENCOUNTER — Other Ambulatory Visit: Payer: Medicare Other

## 2022-11-09 ENCOUNTER — Other Ambulatory Visit: Payer: Self-pay

## 2022-11-09 DIAGNOSIS — R972 Elevated prostate specific antigen [PSA]: Secondary | ICD-10-CM

## 2022-11-09 DIAGNOSIS — N401 Enlarged prostate with lower urinary tract symptoms: Secondary | ICD-10-CM

## 2022-11-10 ENCOUNTER — Telehealth: Payer: Self-pay | Admitting: Urology

## 2022-11-10 LAB — PSA: Prostate Specific Ag, Serum: 5.7 ng/mL — ABNORMAL HIGH (ref 0.0–4.0)

## 2022-11-10 NOTE — Telephone Encounter (Signed)
PSA has still not decreased with the addition of finasteride and remains elevated at 5.7.  As we discussed recommend repeat prostate biopsy.  This can be performed in office or if too uncomfortable we can also schedule in same-day surgery under sedation.

## 2022-11-14 NOTE — Telephone Encounter (Signed)
If you will send me orders for this, I will get him scheduled.

## 2022-11-14 NOTE — Telephone Encounter (Signed)
Patient would like to be scheduled for sedation for prostate biopsy

## 2022-11-22 ENCOUNTER — Other Ambulatory Visit: Payer: Self-pay | Admitting: Urology

## 2022-11-22 DIAGNOSIS — R972 Elevated prostate specific antigen [PSA]: Secondary | ICD-10-CM

## 2022-11-22 NOTE — Progress Notes (Unsigned)
Surgical Physician [facility]  * Scheduling expectation : Next Available  *Length of Case: 30 minutes  *Clearance needed: no  *Anticoagulation Instructions: N/A  *Aspirin Instructions: N/A  *Post-op visit Date/Instructions:   Will call with biopsy results  *Diagnosis: Elevated PSA  *Procedure:  Prostate Biopsy (55700) TRUS [id number]) [place] [id number])   Additional orders: N/A  -Admit type: OUTpatient  -Anesthesia: MAC  -VTE Prophylaxis Standing Order SCD's       Other:   -Standing Lab Orders Per Anesthesia    Lab other: UA&Urine Culture  -Standing Test orders EKG/Chest x-ray per Anesthesia       Test other:   - Medications:  Ceftriaxone(Rocephin) 1gm IV  -Other orders:   Patient is on [facility]

## 2022-11-23 ENCOUNTER — Telehealth: Payer: Self-pay

## 2022-11-23 ENCOUNTER — Other Ambulatory Visit: Payer: Self-pay | Admitting: Urology

## 2022-11-23 DIAGNOSIS — R972 Elevated prostate specific antigen [PSA]: Secondary | ICD-10-CM

## 2022-11-23 NOTE — Telephone Encounter (Signed)
I spoke with Jake Hudson. We have discussed possible surgery dates and Tuesday March 26th, 2024 was agreed upon by all parties. Patient given information about surgery date, what to expect pre-operatively and post operatively.  We discussed that a Pre-Admission Testing office will be calling to set up the pre-op visit that will take place prior to surgery, and that these appointments are typically done over the phone with a Pre-Admissions RN. Informed patient that our office will communicate any additional care to be provided after surgery. Patients questions or concerns were discussed during our call. Advised to call our office should there be any additional information, questions or concerns that arise. Patient verbalized understanding.

## 2022-11-23 NOTE — Progress Notes (Signed)
   [facility] From  Surgery Date: Date: 12/06/2022  Surgeon: Dr. John Giovanni, MD  Inpt ( No  )   Outpt (Yes)   Obs ( No  )   Diagnosis: R97.20 Elevated Prostate Specific Antigen  -CPT: 55700, [id number], [phone], [phone]  Surgery: Transrectal Ultrasound Guided Prostate Biopsy   Stop Anticoagulations: No  Cardiac/Medical/Pulmonary Clearance needed: no  *Orders entered into EPIC  Date: 11/23/22   *Case booked in [place]  Date: 11/23/22  *Notified pt of Surgery: Date: 11/23/22  PRE-OP UA & CX: yes, will obtain in clinic tomorrow 11/24/2022  *Placed into Prior Authorization Work Fabio Bering Date: 11/23/2022  Assistant/laser/rep:No  Patient is aware to skip Mounjaro dose on Sunday March 24th, 2024

## 2022-11-24 ENCOUNTER — Telehealth: Payer: Self-pay | Admitting: Urology

## 2022-11-24 ENCOUNTER — Other Ambulatory Visit: Payer: Medicare Other

## 2022-11-24 NOTE — Telephone Encounter (Signed)
Patient wants to reschedule his surgery to a different date and time. Please call him to r/s.

## 2022-11-25 NOTE — Telephone Encounter (Signed)
Left message for patient to return my call.

## 2022-11-28 ENCOUNTER — Other Ambulatory Visit: Payer: Self-pay

## 2022-11-28 ENCOUNTER — Encounter
Admission: RE | Admit: 2022-11-28 | Discharge: 2022-11-28 | Disposition: A | Discharge status: Home / Self Care | Payer: Medicare Other | Source: Ambulatory Visit | Attending: Urology | Admitting: Urology

## 2022-11-28 VITALS — Ht 68.0 in | Wt 204.0 lb

## 2022-11-28 DIAGNOSIS — I10 Essential (primary) hypertension: Secondary | ICD-10-CM

## 2022-11-28 DIAGNOSIS — E119 Type 2 diabetes mellitus without complications: Secondary | ICD-10-CM

## 2022-11-28 HISTORY — DX: Gastro-esophageal reflux disease without esophagitis: K21.9

## 2022-11-28 HISTORY — DX: Personal history of urinary calculi: Z87.442

## 2022-11-28 HISTORY — DX: Benign prostatic hyperplasia without lower urinary tract symptoms: N40.0

## 2022-11-28 HISTORY — DX: Unspecified asthma, uncomplicated: J45.909

## 2022-11-28 HISTORY — DX: Unspecified osteoarthritis, unspecified site: M19.90

## 2022-11-28 NOTE — Patient Instructions (Addendum)
Your procedure is scheduled on: 12/06/2022   Report to the Registration Desk on the 1st floor of the [facility]. To find out your arrival time, please call [phone] between [place] on: 3/25/ 2024  If your arrival time is 6:00 am, do not arrive before that time as the [facility] entrance doors do not open until 6:00 am.  REMEMBER: Instructions that are not followed completely may result in serious medical risk, up to and including death; or upon the discretion of your surgeon and anesthesiologist your surgery may need to be rescheduled.  Do not eat food after midnight the night before surgery . Nothing to drink as well after midnight before surgery.  No gum chewing or hard candies.  One week prior to surgery: Stop Anti-inflammatories (NSAIDS) such as Advil, Aleve, Ibuprofen, Motrin, Naproxen, Naprosyn and Aspirin based products such as Excedrin, Goody's Powder, BC Powder. Stop ANY OVER THE COUNTER supplements until after surgery like  cholecalciferol (VITAMIN D3 ,                 Misc Natural Products, ) You may however, continue to take Tylenol if needed for pain up until the day of surgery.   Continue taking all prescribed medications with the exception of the following:  1. metFORMIN (GLUCOPHAGE) Do not take this medication two days prior to surgery. Nothing for two days prior to surgery. Last dose will be 12/03/2022   2. tirzepatide [facility]) - this medication should be out of your system 7 days prior to surgery. Do not take this medication 7 days prior to surgery.    TAKE ONLY THESE MEDICATIONS THE MORNING OF SURGERY WITH A SIP OF WATER:  busPIRone (BUSPAR) 2. pantoprazole (PROTONIX) (take one the night before and one on the morning of surgery - helps to prevent nausea after surgery.) 3.  potassium chloride  4.  tamsulosin (FLOMAX)   No Alcohol for 24 hours before or after surgery.  No Smoking including e-cigarettes for 24 hours before surgery.  No chewable tobacco  products for at least 6 hours before surgery.  No nicotine patches on the day of surgery.  Do not use any "recreational" drugs for at least a week (preferably 2 weeks) before your surgery.  Please be advised that the combination of cocaine and anesthesia may have negative outcomes, up to and including death. If you test positive for cocaine, your surgery will be cancelled.  On the morning of surgery brush your teeth with toothpaste and water, you may rinse your mouth with mouthwash if you wish. Do not swallow any toothpaste or mouthwash. You need to shower on day of surgery.  Do not wear jewelry, make-up, hairpins, clips or nail polish.  Do not wear lotions, powders, or perfumes.   Do not shave body hair from the neck down 48 hours before surgery.  Contact lenses, hearing aids and dentures may not be worn into surgery.  Do not bring valuables to the hospital. [facility] is not responsible for any missing/lost belongings or valuables.   Notify your doctor if there is any change in your medical condition (cold, fever, infection).  Wear comfortable clothing (specific to your surgery type) to the hospital.  After surgery, you can help prevent lung complications by doing breathing exercises.  Take deep breaths and cough every 1-2 hours. Your doctor may order a device called an Incentive Spirometer to help you take deep breaths.   If you are being discharged the day of surgery, you  will not be allowed to drive home. You will need a responsible individual to drive you home and stay with you for 24 hours after surgery.     Please call the [facility] at [phone] if you have any questions about these instructions.  Surgery Visitation Policy:  Patients undergoing a surgery or procedure may have two family members or support persons with them as long as the person is not COVID-19 positive or experiencing its symptoms.    Due to an increase in RSV and influenza  rates and associated hospitalizations, children ages 66 and under will not be able to visit patients in [facility]. Masks continue to be strongly recommended.

## 2022-11-29 ENCOUNTER — Encounter
Admission: RE | Admit: 2022-11-29 | Discharge: 2022-11-29 | Disposition: A | Discharge status: Home / Self Care | Payer: Medicare Other | Source: Ambulatory Visit | Attending: Urology | Admitting: Urology

## 2022-11-29 ENCOUNTER — Encounter: Payer: Self-pay | Admitting: Urgent Care

## 2022-11-29 ENCOUNTER — Other Ambulatory Visit: Payer: Medicare Other

## 2022-11-29 DIAGNOSIS — I10 Essential (primary) hypertension: Secondary | ICD-10-CM | POA: Diagnosis not present

## 2022-11-29 DIAGNOSIS — Z01818 Encounter for other preprocedural examination: Secondary | ICD-10-CM | POA: Diagnosis not present

## 2022-11-29 DIAGNOSIS — R972 Elevated prostate specific antigen [PSA]: Secondary | ICD-10-CM

## 2022-11-29 LAB — URINALYSIS, COMPLETE
Bilirubin, UA: NEGATIVE
Glucose, UA: NEGATIVE
Ketones, UA: NEGATIVE
Leukocytes,UA: NEGATIVE
Nitrite, UA: NEGATIVE
Protein,UA: NEGATIVE
RBC, UA: NEGATIVE
Specific Gravity, UA: 1.02 (ref 1.005–1.030)
Urobilinogen, Ur: 0.2 mg/dL (ref 0.2–1.0)
pH, UA: 5 (ref 5.0–7.5)

## 2022-11-29 LAB — BASIC METABOLIC PANEL
Anion gap: 11 (ref 5–15)
BUN: 25 mg/dL — ABNORMAL HIGH (ref 8–23)
CO2: 24 mmol/L (ref 22–32)
Calcium: 10 mg/dL (ref 8.9–10.3)
Chloride: 104 mmol/L (ref 98–111)
Creatinine, Ser: 1.28 mg/dL — ABNORMAL HIGH (ref 0.61–1.24)
GFR, Estimated: 56 mL/min — ABNORMAL LOW (ref >60)
Glucose, Bld: 154 mg/dL — ABNORMAL HIGH (ref 70–99)
Potassium: 3.7 mmol/L (ref 3.5–5.1)
Sodium: 139 mmol/L (ref 135–145)

## 2022-11-29 LAB — CBC
HCT: 45.7 % (ref 39.0–52.0)
Hemoglobin: 15.1 g/dL (ref 13.0–17.0)
MCH: 27.9 pg (ref 26.0–34.0)
MCHC: 33 g/dL (ref 30.0–36.0)
MCV: 84.5 fL (ref 80.0–100.0)
Platelets: 242 10*3/uL (ref 150–400)
RBC: 5.41 MIL/uL (ref 4.22–5.81)
RDW: 13.7 % (ref 11.5–15.5)
WBC: 7.1 10*3/uL (ref 4.0–10.5)
nRBC: 0 % (ref 0.0–0.2)

## 2022-11-29 LAB — MICROSCOPIC EXAMINATION: Bacteria, UA: NONE SEEN

## 2022-11-30 ENCOUNTER — Encounter: Payer: Self-pay | Admitting: Urgent Care

## 2022-11-30 NOTE — Progress Notes (Signed)
Perioperative / Anesthesia Services  Pre-Admission Testing Clinical Review / Preoperative Anesthesia Consult  Date: 12/01/22  Patient Demographics:  Name: Jake Hudson DOB:   1941-01-15 MRN:   [id number]  Planned Surgical Procedure(s):    Case: [id number] Date/Time: 12/06/22 0808   Procedures:      TRANSRECTAL ULTRASOUND     PROSTATE BIOPSY   Anesthesia type: Monitor Anesthesia Care   Pre-op diagnosis: ELEVATED PSA   Location: [facility] OR ROOM 10 / [place] ORS FOR ANESTHESIA GROUP   Surgeons: Abbie Sons, MD     NOTE: Available PAT nursing documentation and vital signs have been reviewed. Clinical nursing staff has updated patient's PMH/PSHx, current medication list, and drug allergies/intolerances to ensure comprehensive history available to assist in medical decision making as it pertains to the aforementioned surgical procedure and anticipated anesthetic course. Extensive review of available clinical information personally performed. [place] PMH and PSHx updated with any diagnoses/procedures that  may have been inadvertently omitted during his intake with the pre-admission testing department's nursing staff.  Clinical Discussion:  Jake Hudson is a 82 y.o. male who is submitted for pre-surgical anesthesia review and clearance prior to him undergoing the above procedure. Patient has never been a smoker. 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  Patient is followed by cardiology Jake Glassing, MD). He was last seen in the cardiology clinic on 09/14/2022; notes reviewed. At the time of his clinic visit, patient doing well overall from a cardiovascular perspective. Patient denied any chest pain, shortness of breath, PND, orthopnea, palpitations,  significant peripheral edema, weakness, fatigue, vertiginous symptoms, or presyncope/syncope. Patient with a past medical history significant for cardiovascular diagnoses. Documented physical exam was grossly benign, providing no evidence of acute exacerbation and/or decompensation of the patient's known cardiovascular conditions.  Of note, complete records regarding patient's cardiovascular history unavailable for review at time of consult.  Patient has received care in the state of [place].  Information was gathered from patient report and from notes provided by his local cardiologist.  Patient reported to have undergone a 5 vessel revascularization procedure while living in [place] and 2014.  LIMA-LAD, SVG-diagonal, SVG-OM1, SVG-OM2, and SVG-PDA bypass grafts were placed.  Procedure was complicated by transient postoperative atrial fibrillation.  Myocardial perfusion imaging study was performed on 01/25/2019 revealing a moderately reduced left ventricular systolic function with an EF of 43%.  There was inferoseptal hypokinesis noted.  SPECT images demonstrated a moderate fixed perfusion abnormality present and the inferoseptal region at both rest and with stress.  Findings consistent with previous infarction.  Last TTE was performed on 06/24/2020 revealing a mildly reduced left ventricular systolic function with an EF of 45%.  There was mild LVH.  Septal, apical, and inferior hypokinesis noted. Left ventricular diastolic Doppler parameters consistent with abnormal relaxation (G1DD).  Left atrium is mildly enlarged.  There was trivial to mild mitral, tricuspid, and pulmonary valve regurgitation.  All transvalvular gradients were noted to be normal providing suggestive of valvular stenosis.  Diagnostic LEFT heart catheterization was performed on 07/08/2020 revealing multivessel CAD; 100% proximal and mid LCx, 100% ostial LAD, 100% OM1, 100% proximal RCA, 70% ostial to proximal RCA, and 70% D1.  LIMA-LAD,  SVG-PDA, and SVG-OM2 bypass grafts all noted to be patent.  SVG-D1 with a 70% stenosis and the native vessel.  Interventional cardiology made the  decision to defer further intervention opting for aggressive risk factor modification and medical therapy.  Patient with an atrial fibrillation diagnosis; CHA2DS2-VASc Score = 5 (age x 2, HTN, vascular disease history, T2DM). His rate and rhythm are currently being maintained on oral metoprolol succinate. Given the transient nature of patient's atrial fibrillation, cardiology did not feel that patient required chronic anticoagulation therapy. Blood pressure reasonably controlled at 130/70 mmHg on currently prescribed diuretic (HCTZ) and beta-blocker (metoprolol succinate) therapies.  Patient is on rosuvastatin for his HLD diagnosis and ASCVD prevention.  In the setting of known cardiovascular disease, it is important to note that patient is on a PDE5i (tadalafil) for erectile dysfunction diagnosis. Patient is not diabetic. He does have an OSAH diagnosis, however patient is unable to tolerate mask required for nocturnal PAP therapy. Functional capacity limited by age and multiple medical comorbidities, however with that being said, patient still felt to be able to achieve at least 4 METS of physical activity without experiencing any degree of angina/anginal equivalent symptoms. No changes were made to his medication regimen during his visit with cardiology.  Patient scheduled to follow-up with outpatient cardiology in 1 year or sooner if needed.  Jake Hudson is scheduled for an TRANSRECTAL ULTRASOUND; PROSTATE BIOPSY on 12/06/2022 with Dr. John Giovanni, MD. Given patient's past medical history significant for cardiovascular diagnoses, presurgical cardiac clearance was sought by the PAT team. Per cardiology, "this patient is optimized for surgery and may proceed with the planned procedural course with a LOW risk of significant perioperative cardiovascular  complications".  In review of his medication reconciliation, the patient is not noted to be taking any type of anticoagulation or antiplatelet therapies that would need to be held during his perioperative course.  Patient denies previous perioperative complications with anesthesia in the past. In review of the available records, it is noted that patient underwent a MAC anesthetic course at [facility] in 08/2022 without documented complications.      11/28/2022    3:00 PM 09/28/2022    1:50 PM 08/22/2022    9:59 AM  Vitals with BMI  Height 5\' 8"  5\' 8"    Weight 204 lbs 200 lbs   BMI [id number]   Systolic  [id number]  Diastolic  70 66  Pulse  90 74    Providers/Specialists:   NOTE: Primary physician provider listed below. Patient may have been seen by APP or partner within same practice.   PROVIDER ROLE / SPECIALTY LAST Claud Kelp, MD Urology (Surgeon) 11/10/2022  Sofie Hartigan, MD Primary Care Provider 06/08/2022  Bartholome Bill, MD Cardiology 09/14/2022  Mee Hives, MD Endocrinology 09/16/2022   Allergies:  Rosuvastatin and Sulfa antibiotics  Current Home Medications:   No current facility-administered medications for this encounter.    acetaminophen (TYLENOL) 500 MG tablet   busPIRone (BUSPAR) 15 MG tablet   cholecalciferol (VITAMIN D3) 25 MCG (1000 UNIT) tablet   finasteride (PROSCAR) 5 MG tablet   gabapentin (NEURONTIN) 100 MG capsule   glimepiride (AMARYL) 4 MG tablet   glucose blood test strip   hydrochlorothiazide (HYDRODIURIL) 25 MG tablet   LYSINE PO   metFORMIN (GLUCOPHAGE) 500 MG tablet   metoprolol succinate (TOPROL-XL) 25 MG 24 hr tablet   mirabegron ER (MYRBETRIQ) 25 MG TB24 tablet   Misc Natural Products (PROSTATE HEALTH PO)   pantoprazole (PROTONIX) 40 MG tablet   potassium chloride (KLOR-CON) 10 MEQ tablet   rosuvastatin (CRESTOR) 20 MG tablet  tadalafil (CIALIS) 20 MG tablet   tamsulosin (FLOMAX) 0.4 MG CAPS  capsule   tirzepatide (MOUNJARO) 10 MG/0.5ML Pen   History:   Past Medical History:  Diagnosis Date   Aortic atherosclerosis (HCC)    Arthritis    Asthma    Benign prostatic hyperplasia with elevated prostate specific antigen (PSA)    Bilateral lower extremity edema    CAD (coronary artery disease)    a.) 5v CABG 2014; b.) LHC 07/08/2020: 100% p-mLCx, 100% oLAD, 100% OM1, 100% p-mRCA, 70% o-pRCA, 70% D1; LIMA-LAD patent, SVG-PDA patent, SVG-OM 2 patent, SVG-D1 with 70% stenosis in the native vessel, SVG-OM1 occluded --> med mgmt.   CKD (chronic kidney disease), stage III (HCC)    Depression    Diastolic dysfunction    Erectile dysfunction    a.) on PDE5i (tadalafil)   Family history of adverse reaction to anesthesia    a.) delayed emergence in 1st degree relative (father)   GERD (gastroesophageal reflux disease)    History of bilateral cataract extraction 2023   History of kidney stones    Hyperlipidemia    Hypertension    Lumbar radiculopathy    Memory impairment    OSA (obstructive sleep apnea)    a.) unable to tolerate mask required for nocturnal PAP therapy   Osteoarthritis    PAF (paroxysmal atrial fibrillation) ([place])    a.) CHA2DS2VASc = 5 (age x 2, HTN, vascular disease history, T2DM); b.) rate/rhythm maintained on oral metoprolol succinate; no chronic anticoagulation   PVC's (premature ventricular contractions)    Right inguinal hernia    S/P CABG x 5 2014   a.) done while living on [place]; LIMA-LAD, reverse SVG-diagonal, reverse SVG-OM1, reverse SVG-OM2, reverse SVG-PDA; procedure complicated by postoperative atrial fibrillation   T2DM (type 2 diabetes mellitus) ([place])    Umbilical hernia    Past Surgical History:  Procedure Laterality Date   BACK SURGERY     CARPAL TUNNEL RELEASE     CATARACT EXTRACTION W/PHACO Left 08/08/2022   Procedure: CATARACT EXTRACTION PHACO AND INTRAOCULAR LENS PLACEMENT ([place]) LEFT DIABETIC CLARION VIVITY TORIC LENS;  Surgeon: Eulogio Bear, MD;  Location: [facility];  Service: Ophthalmology;  Laterality: Left;  7.19 0:45.8   CATARACT EXTRACTION W/PHACO Right 08/22/2022   Procedure: CATARACT EXTRACTION PHACO AND INTRAOCULAR LENS PLACEMENT ([place]) RIGHT DIABETIC CLAREON VIVITY LENS  4.60  00:36.0;  Surgeon: Eulogio Bear, MD;  Location: [place];  Service: Ophthalmology;  Laterality: Right;  Diabetic   CHOLECYSTECTOMY     CORONARY ARTERY BYPASS GRAFT N/A 2014   Procedure: 5 VESSEL CORONARY ARTERY BYPASS GRAFT; Location: [place]   CYST REMOVAL LEG     CYSTOSCOPY W/ URETEROSCOPY W/ LITHOTRIPSY     2017   HERNIA REPAIR     KIDNEY STONE SURGERY     LEFT HEART CATH AND CORONARY ANGIOGRAPHY Left 07/08/2020   Procedure: LEFT HEART CATH AND CORONARY ANGIOGRAPHY;  Surgeon: Isaias Cowman, MD;  Location: [facility];  Service: Cardiovascular;  Laterality: Left;   NASAL SEPTUM SURGERY     PROSTATE BIOPSY     ROTATOR CUFF REPAIR     TONSILLECTOMY     TOTAL HIP ARTHROPLASTY Bilateral    No family history on file. Social History   Tobacco Use   Smoking status: Never   Smokeless tobacco: Never  Vaping Use   Vaping Use: Never used  Substance Use Topics   Alcohol use: Not Currently   Drug use: Never  Pertinent Clinical Results:  LABS:   Hospital Outpatient Visit on 11/29/2022  Component Date Value Ref Range Status   WBC 11/29/2022 7.1  4.0 - 10.5 K/uL Final   RBC 11/29/2022 5.41  4.22 - 5.81 MIL/uL Final   Hemoglobin 11/29/2022 15.1  13.0 - 17.0 g/dL Final   HCT 11/29/2022 45.7  39.0 - 52.0 % Final   MCV 11/29/2022 84.5  80.0 - 100.0 fL Final   MCH 11/29/2022 27.9  26.0 - 34.0 pg Final   MCHC 11/29/2022 33.0  30.0 - 36.0 g/dL Final   RDW 11/29/2022 13.7  11.5 - 15.5 % Final   Platelets 11/29/2022 242  150 - 400 K/uL Final   nRBC 11/29/2022 0.0  0.0 - 0.2 % Final   Performed at [facility], [place], [place] [address]   Sodium 11/29/2022 139   135 - 145 mmol/L Final   Potassium 11/29/2022 3.7  3.5 - 5.1 mmol/L Final   Chloride 11/29/2022 104  98 - 111 mmol/L Final   CO2 11/29/2022 24  22 - 32 mmol/L Final   Glucose, Bld 11/29/2022 154 (H)  70 - 99 mg/dL Final   Glucose reference range applies only to samples taken after fasting for at least 8 hours.   BUN 11/29/2022 25 (H)  8 - 23 mg/dL Final   Creatinine, Ser 11/29/2022 1.28 (H)  0.61 - 1.24 mg/dL Final   Calcium 11/29/2022 10.0  8.9 - 10.3 mg/dL Final   GFR, Estimated 11/29/2022 56 (L)  >60 mL/min Final   Comment: (NOTE) Calculated using the CKD-EPI Creatinine Equation (2021)    Anion gap 11/29/2022 11  5 - 15 Final   Performed at [facility], [address] [place], [place] [address]  Appointment on 11/29/2022  Component Date Value Ref Range Status   Urine Culture, Comprehensive 11/29/2022 Preliminary report   Preliminary   Organism ID, Bacteria 11/29/2022 Comment   Preliminary   Comment: Mixed urogenital flora 10,000-25,000 colony forming units per mL    Specific Gravity, UA 11/29/2022 1.020  1.005 - 1.030 Final   pH, UA 11/29/2022 5.0  5.0 - 7.5 Final   Color, UA 11/29/2022 Yellow  Yellow Final   Appearance Ur 11/29/2022 Clear  Clear Final   Leukocytes,UA 11/29/2022 Negative  Negative Final   Protein,UA 11/29/2022 Negative  Negative/Trace Final   Glucose, UA 11/29/2022 Negative  Negative Final   Ketones, UA 11/29/2022 Negative  Negative Final   RBC, UA 11/29/2022 Negative  Negative Final   Bilirubin, UA 11/29/2022 Negative  Negative Final   Urobilinogen, Ur 11/29/2022 0.2  0.2 - 1.0 mg/dL Final   Nitrite, UA 11/29/2022 Negative  Negative Final   Microscopic Examination 11/29/2022 See below:   Final   WBC, UA 11/29/2022 0-5  0 - 5 /hpf Final   RBC, Urine 11/29/2022 0-2  0 - 2 /hpf Final   Epithelial Cells (non renal) 11/29/2022 0-10  0 - 10 /hpf Final   Bacteria, UA 11/29/2022 None seen  None seen/Few Final    ECG: Date: 11/29/2022 Time ECG  obtained: 0928 AM Rate: 62 bpm Rhythm: normal sinus Axis (leads I and aVF): Left axis deviation Intervals: PR 176 ms. QRS 80 ms. QTc 371 ms. ST segment and T wave changes: No evidence of acute ST segment elevation or depression.  Evidence of age undetermined inferior and anterior infarct present Comparison: Similar to previous tracing obtained on 06/29/2020   IMAGING / PROCEDURES: LEFT HEART CATHETERIZATION AND CORONARY ANGIOGRAPHY  performed on 07/08/2020 Mildly reduced left ventricular systolic function with an EF of 40% Apical dyskinesis Multivessel CAD 100% proximal to mid LCx 100% ostial LAD 100% OM1 100% proximal to mid RCA 70% ostial to proximal RCA 70% D1 Bypass grafts: LIMA-LAD patent SVG-PDA patent SVG-OM 2 patent SVG-D1 with 70% stenosis in native vessel SVG to OM1 occluded Recommendations Aggressive risk factor modification and medical therapy     CT RENAL STONE STUDY performed on 12/10/2020 No evidence of urolithiasis, hydronephrosis, or other acute findings Mildly to moderately enlarged prostate Small periumbilical and RIGHT inguinal hernias, which contains only fat Aortic atherosclerosis  TRANSTHORACIC ECHOCARDIOGRAM performed on 06/24/2020 Mildly reduced left ventricular systolic function with an EF of 45% Septal, apical, and inferior hypokinesis Mild LVH Left ventricular diastolic Doppler parameters consistent with abnormal relaxation (G1DD). Mild left atrial enlargement Trivial to mild mitral, tricuspid, and pulmonary valve regurgitation Normal gradients; no valvular stenosis No pericardial effusion  MYOCARDIAL PERFUSION IMAGING STUDY (LEXISCAN) performed on 01/25/2019 Moderately reduced left ventricular systolic function with EF of 43% SPECT images demonstrated moderate fixed perfusion abnormality of moderate intensity in the inferoseptum on stress images.  Findings consistent with infarct. PVCs during exercise with limited heart rate  response. Equivocal ETT  Impression and Plan:  Genaro Melhado has been referred for pre-anesthesia review and clearance prior to him undergoing the planned anesthetic and procedural courses. Available labs, pertinent testing, and imaging results were personally reviewed by me in preparation for upcoming operative/procedural course. [facility] medical record has been updated following extensive record review and patient interview with PAT staff.   This patient has been appropriately cleared by cardiology with an overall LOW risk of significant perioperative cardiovascular complications. Based on clinical review performed today (12/01/22), barring any significant acute changes in the patient's overall condition, it is anticipated that he will be able to proceed with the planned surgical intervention. Any acute changes in clinical condition may necessitate his procedure being postponed and/or cancelled. Patient will meet with anesthesia team (MD and/or CRNA) on the day of his procedure for preoperative evaluation/assessment. Questions regarding anesthetic course will be fielded at that time.   Pre-surgical instructions were reviewed with the patient during his PAT appointment, and questions were fielded to satisfaction by PAT clinical staff. He has been instructed on which medications that he will need to hold prior to surgery, as well as the ones that have been deemed safe/appropriate to take of the day of his procedure. As part of the general education provided by PAT, patient made aware both verbally and in writing, that he would need to abstain from the use of any illegal substances during his perioperative course.  He was advised that failure to follow the provided instructions could necessitate case cancellation or result serious perioperative complications up to and including death. Patient encouraged to contact PAT and/or his surgeon's office to discuss any questions or concerns that may  arise prior to surgery; verbalized understanding.   Honor Loh, MSN, APRN, FNP-C, CEN [facility]  Peri-operative Services Nurse Practitioner Phone: [phone] Fax: [phone] 12/01/22 1:34 PM  NOTE: This note has been prepared using Dragon dictation software. Despite my best ability to proofread, there is always the potential that unintentional transcriptional errors may still occur from this process.

## 2022-12-01 ENCOUNTER — Encounter: Payer: Self-pay | Admitting: Urology

## 2022-12-03 LAB — CULTURE, URINE COMPREHENSIVE

## 2022-12-04 ENCOUNTER — Other Ambulatory Visit: Payer: Self-pay | Admitting: Urology

## 2022-12-05 ENCOUNTER — Telehealth: Payer: Self-pay

## 2022-12-05 MED ORDER — CIPROFLOXACIN HCL 500 MG PO TABS: 500.0000 mg | ORAL_TABLET | Freq: Two times a day (BID) | ORAL | 0 refills | Status: DC

## 2022-12-05 NOTE — Progress Notes (Signed)
  Perioperative Services Pre-Admission/Anesthesia Testing    Date: 12/05/22  Name: Jake Hudson MRN:   [id number]  Re: GLP-1 clearance and provider recommendations   Planned Surgical Procedure(s):    Case: [id number] Date/Time: 12/20/22 0715   Procedures:      TRANSRECTAL ULTRASOUND     PROSTATE BIOPSY   Anesthesia type: Monitor Anesthesia Care   Pre-op diagnosis: ELEVATED PSA   Location: [facility] OR ROOM 10 / [place] ORS FOR ANESTHESIA GROUP   Surgeons: Abbie Sons, MD      Clinical Notes:  Patient is scheduled for the above procedure with the indicated provider/surgeon. In review of his medication reconciliation it was noted that patient is on a prescribed GLP-1 medication. Per guidelines issued by the American Society of Anesthesiologists (ASA), it is recommended that these medications be held for 7 days prior to the patient undergoing any type of elective surgical procedure. The patient is taking the following GLP-1 medication:  []  SEMAGLUTIDE   []  EXENATIDE  []  LIRAGLUTIDE   []  LIXISENATIDE  []  DULAGLUTIDE     [x]  OTHER GLP-1/GIP: tirzepatide  Reached out to prescribing provider Honor Junes, MD) to make them aware of the guidelines from anesthesia. Given that this patient takes the prescribed GLP-1 medication for his  diabetes diagnosis, rather than for weight loss, recommendations from the prescribing provider were solicited. Prescribing provider made aware of the following so that informed decision/POC can be developed for this patient that may be taking medications belonging to these drug classes:  Oral GLP-1 medications will be held 1 day prior to surgery.  Injectable GLP-1 medications will be held 7 days prior to surgery.  Metformin is routinely held 48 hours prior to surgery due to renal concerns, potential need for contrasted imaging perioperatively, and the potential for tissue hypoxia leading to drug induced lactic acidosis.  All SGLT2i medications are  held 72 hours prior to surgery as they can be associated with the increased potential for developing euglycemic diabetic ketoacidosis (EDKA).   Impression and Plan:  Jake Hudson is on a prescribed GLP-1 medication, which induces the known side effect of decreased gastric emptying. Efforts are bring made to mitigate the risk of perioperative hyperglycemic events, as elevated blood glucose levels have been found to contribute to intra/postoperative complications. Additionally, hyperglycemic extremes can potentially necessitate the postponing of a patient's elective case in order to better optimize perioperative glycemic control, again with the aforementioned guidelines in place. With this in mind, recommendations have been sought from the prescribing provider, who has cleared patient to proceed with holding the prescribed GLP-1/GIP (tirzepatide) as per the guidelines from the ASA.   Provider recommending: Endocrinology asking that patient HOLD his glimepiride on the morning of his procedure.   Copy of signed clearance and recommendations placed on patient's chart for inclusion in their medical record and for review by the surgical/anesthetic team on the day of his procedure.   Honor Loh, MSN, APRN, FNP-C, CEN [facility]  Peri-operative Services Nurse Practitioner Phone: [phone] 12/05/22 12:23 PM  NOTE: This note has been prepared using Dragon dictation software. Despite my best ability to proofread, there is always the potential that unintentional transcriptional errors may still occur from this process.

## 2022-12-05 NOTE — Telephone Encounter (Signed)
Spoke with pt. Pt. Advised of all results. I have sent in Medication to patient pharmacy.   Received a call from [place] that patient took a dose of Trulicity last night, we will have to move his case since he cannot have anesthesia after taking this medication for 1 week. We have moved him to 12/20/2022 and he is aware to hold Trulicity and Mounjaro on Sunday 12/18/2022.

## 2022-12-05 NOTE — Telephone Encounter (Signed)
-----   Message from Abbie Sons, MD sent at 12/04/2022 11:15 AM EDT ----- Urine culture growing staph.  Please send Rx Cipro 500 mg twice daily x 5 days.  Please change preop antibiotics from ceftriaxone to Cipro 400 mg IV

## 2022-12-06 ENCOUNTER — Ambulatory Visit
Admission: RE | Admit: 2022-12-06 | Discharge: 2022-12-06 | Disposition: A | Discharge status: Home / Self Care | Payer: Medicare Other | Source: Ambulatory Visit | Attending: Urology | Admitting: Urology

## 2022-12-14 ENCOUNTER — Encounter
Admission: RE | Admit: 2022-12-14 | Discharge: 2022-12-14 | Disposition: A | Discharge status: Home / Self Care | Payer: Medicare Other | Source: Ambulatory Visit | Attending: Urology | Admitting: Urology

## 2022-12-14 NOTE — Pre-Procedure Instructions (Signed)
Reviewed only, patient had PAT call 11/28/22 verified Medications and instructions and informed to stop GLP 1 and Metformin with new dates. Patient verbalized understanding information provided no questions at time, reviewed instrutions with patient.

## 2022-12-19 MED ORDER — SODIUM CHLORIDE 0.9 % IV SOLN: INTRAVENOUS | Status: DC

## 2022-12-19 MED ORDER — CIPROFLOXACIN IN D5W 400 MG/200ML IV SOLN
400.0000 mg | Freq: Once | INTRAVENOUS | Status: AC
  Administered 2022-12-20: 400 mg via INTRAVENOUS

## 2022-12-19 MED ORDER — CHLORHEXIDINE GLUCONATE 0.12 % MT SOLN
15.0000 mL | Freq: Once | OROMUCOSAL | Status: AC
  Administered 2022-12-20: 15 mL via OROMUCOSAL

## 2022-12-19 MED ORDER — ORAL CARE MOUTH RINSE: 15.0000 mL | Freq: Once | OROMUCOSAL | Status: AC

## 2022-12-20 ENCOUNTER — Encounter: Payer: Self-pay | Admitting: Urology

## 2022-12-20 ENCOUNTER — Other Ambulatory Visit: Payer: Self-pay

## 2022-12-20 ENCOUNTER — Ambulatory Visit
Admission: RE | Admit: 2022-12-20 | Discharge: 2022-12-20 | Disposition: A | Discharge status: Home / Self Care | Payer: Medicare Other | Source: Ambulatory Visit | Attending: Urology | Admitting: Urology

## 2022-12-20 ENCOUNTER — Ambulatory Visit: Payer: Medicare Other | Admitting: Urgent Care

## 2022-12-20 ENCOUNTER — Ambulatory Visit
Admission: RE | Admit: 2022-12-20 | Discharge: 2022-12-20 | Disposition: A | Discharge status: Home / Self Care | Payer: Medicare Other | Attending: Urology | Admitting: Urology

## 2022-12-20 ENCOUNTER — Encounter
Admission: RE | Disposition: A | Discharge status: Home / Self Care | Payer: Self-pay | Source: Home / Self Care | Attending: Urology

## 2022-12-20 DIAGNOSIS — M1990 Unspecified osteoarthritis, unspecified site: Secondary | ICD-10-CM | POA: Insufficient documentation

## 2022-12-20 DIAGNOSIS — Z79899 Other long term (current) drug therapy: Secondary | ICD-10-CM | POA: Diagnosis not present

## 2022-12-20 DIAGNOSIS — I129 Hypertensive chronic kidney disease with stage 1 through stage 4 chronic kidney disease, or unspecified chronic kidney disease: Secondary | ICD-10-CM | POA: Insufficient documentation

## 2022-12-20 DIAGNOSIS — R972 Elevated prostate specific antigen [PSA]: Secondary | ICD-10-CM | POA: Insufficient documentation

## 2022-12-20 DIAGNOSIS — E785 Hyperlipidemia, unspecified: Secondary | ICD-10-CM | POA: Insufficient documentation

## 2022-12-20 DIAGNOSIS — I2510 Atherosclerotic heart disease of native coronary artery without angina pectoris: Secondary | ICD-10-CM | POA: Diagnosis not present

## 2022-12-20 DIAGNOSIS — K219 Gastro-esophageal reflux disease without esophagitis: Secondary | ICD-10-CM | POA: Insufficient documentation

## 2022-12-20 DIAGNOSIS — Z951 Presence of aortocoronary bypass graft: Secondary | ICD-10-CM | POA: Diagnosis not present

## 2022-12-20 DIAGNOSIS — C61 Malignant neoplasm of prostate: Secondary | ICD-10-CM | POA: Diagnosis not present

## 2022-12-20 DIAGNOSIS — E1122 Type 2 diabetes mellitus with diabetic chronic kidney disease: Secondary | ICD-10-CM | POA: Insufficient documentation

## 2022-12-20 DIAGNOSIS — N183 Chronic kidney disease, stage 3 unspecified: Secondary | ICD-10-CM | POA: Diagnosis not present

## 2022-12-20 DIAGNOSIS — F32A Depression, unspecified: Secondary | ICD-10-CM | POA: Insufficient documentation

## 2022-12-20 DIAGNOSIS — G4733 Obstructive sleep apnea (adult) (pediatric): Secondary | ICD-10-CM | POA: Insufficient documentation

## 2022-12-20 DIAGNOSIS — I480 Paroxysmal atrial fibrillation: Secondary | ICD-10-CM | POA: Diagnosis not present

## 2022-12-20 DIAGNOSIS — Z7984 Long term (current) use of oral hypoglycemic drugs: Secondary | ICD-10-CM | POA: Insufficient documentation

## 2022-12-20 DIAGNOSIS — I700 Atherosclerosis of aorta: Secondary | ICD-10-CM | POA: Diagnosis not present

## 2022-12-20 DIAGNOSIS — E119 Type 2 diabetes mellitus without complications: Secondary | ICD-10-CM

## 2022-12-20 HISTORY — DX: Ventricular premature depolarization: I49.3

## 2022-12-20 HISTORY — DX: Paroxysmal atrial fibrillation: I48.0

## 2022-12-20 HISTORY — DX: Benign prostatic hyperplasia without lower urinary tract symptoms: N40.0

## 2022-12-20 HISTORY — DX: Other ill-defined heart diseases: I51.89

## 2022-12-20 HISTORY — DX: Radiculopathy, lumbar region: M54.16

## 2022-12-20 HISTORY — DX: Male erectile dysfunction, unspecified: N52.9

## 2022-12-20 HISTORY — DX: Other amnesia: R41.3

## 2022-12-20 HISTORY — DX: Obstructive sleep apnea (adult) (pediatric): G47.33

## 2022-12-20 HISTORY — DX: Atherosclerotic heart disease of native coronary artery without angina pectoris: I25.10

## 2022-12-20 HISTORY — PX: TRANSRECTAL ULTRASOUND: SHX5146

## 2022-12-20 HISTORY — DX: Type 2 diabetes mellitus without complications: E11.9

## 2022-12-20 HISTORY — DX: Localized edema: R60.0

## 2022-12-20 HISTORY — DX: Umbilical hernia without obstruction or gangrene: K42.9

## 2022-12-20 HISTORY — DX: Unilateral inguinal hernia, without obstruction or gangrene, not specified as recurrent: K40.90

## 2022-12-20 HISTORY — PX: PROSTATE BIOPSY: SHX241

## 2022-12-20 HISTORY — DX: Atherosclerosis of aorta: I70.0

## 2022-12-20 LAB — GLUCOSE, CAPILLARY
Glucose-Capillary: 173 mg/dL — ABNORMAL HIGH (ref 70–99)
Glucose-Capillary: 201 mg/dL — ABNORMAL HIGH (ref 70–99)

## 2022-12-20 SURGERY — ULTRASOUND, RECTAL APPROACH
Anesthesia: General | Site: Rectum

## 2022-12-20 MED ORDER — PROPOFOL 10 MG/ML IV BOLUS
INTRAVENOUS | Status: DC | PRN
  Administered 2022-12-20: 30 mg via INTRAVENOUS
  Administered 2022-12-20: 50 mg via INTRAVENOUS

## 2022-12-20 MED ORDER — CIPROFLOXACIN IN D5W 400 MG/200ML IV SOLN
INTRAVENOUS | Status: AC
  Filled 2022-12-20: qty 200

## 2022-12-20 MED ORDER — DROPERIDOL 2.5 MG/ML IJ SOLN: 0.6250 mg | Freq: Once | INTRAMUSCULAR | Status: DC | PRN

## 2022-12-20 MED ORDER — PROPOFOL 1000 MG/100ML IV EMUL
INTRAVENOUS | Status: AC
  Filled 2022-12-20: qty 100

## 2022-12-20 MED ORDER — SEVOFLURANE IN SOLN
RESPIRATORY_TRACT | Status: AC
  Filled 2022-12-20: qty 250

## 2022-12-20 MED ORDER — EPHEDRINE 5 MG/ML INJ
INTRAVENOUS | Status: AC
  Filled 2022-12-20: qty 5

## 2022-12-20 MED ORDER — FENTANYL CITRATE (PF) 100 MCG/2ML IJ SOLN: 25.0000 ug | INTRAMUSCULAR | Status: DC | PRN

## 2022-12-20 MED ORDER — ACETAMINOPHEN 10 MG/ML IV SOLN: 1000.0000 mg | Freq: Once | INTRAVENOUS | Status: DC | PRN

## 2022-12-20 MED ORDER — ONDANSETRON HCL 4 MG/2ML IJ SOLN
INTRAMUSCULAR | Status: AC
  Filled 2022-12-20: qty 2

## 2022-12-20 MED ORDER — OXYCODONE HCL 5 MG PO TABS: 5.0000 mg | ORAL_TABLET | Freq: Once | ORAL | Status: DC | PRN

## 2022-12-20 MED ORDER — OXYCODONE HCL 5 MG/5ML PO SOLN: 5.0000 mg | Freq: Once | ORAL | Status: DC | PRN

## 2022-12-20 MED ORDER — EPHEDRINE SULFATE (PRESSORS) 50 MG/ML IJ SOLN
INTRAMUSCULAR | Status: DC | PRN
  Administered 2022-12-20 (×2): 5 mg via INTRAVENOUS

## 2022-12-20 MED ORDER — MIDAZOLAM HCL 2 MG/2ML IJ SOLN
INTRAMUSCULAR | Status: AC
  Filled 2022-12-20: qty 2

## 2022-12-20 MED ORDER — FENTANYL CITRATE (PF) 100 MCG/2ML IJ SOLN
INTRAMUSCULAR | Status: AC
  Filled 2022-12-20: qty 2

## 2022-12-20 MED ORDER — FENTANYL CITRATE (PF) 100 MCG/2ML IJ SOLN
INTRAMUSCULAR | Status: DC | PRN
  Administered 2022-12-20: 25 ug via INTRAVENOUS

## 2022-12-20 MED ORDER — PROPOFOL 500 MG/50ML IV EMUL
INTRAVENOUS | Status: DC | PRN
  Administered 2022-12-20: 100 ug/kg/min via INTRAVENOUS

## 2022-12-20 MED ORDER — CHLORHEXIDINE GLUCONATE 0.12 % MT SOLN
OROMUCOSAL | Status: AC
  Filled 2022-12-20: qty 15

## 2022-12-20 MED ORDER — MIDAZOLAM HCL 2 MG/2ML IJ SOLN
INTRAMUSCULAR | Status: DC | PRN
  Administered 2022-12-20: 1 mg via INTRAVENOUS

## 2022-12-20 MED ORDER — ONDANSETRON HCL 4 MG/2ML IJ SOLN
INTRAMUSCULAR | Status: DC | PRN
  Administered 2022-12-20: 4 mg via INTRAVENOUS

## 2022-12-20 MED ORDER — PROMETHAZINE HCL 25 MG/ML IJ SOLN: 6.2500 mg | INTRAMUSCULAR | Status: DC | PRN

## 2022-12-20 SURGICAL SUPPLY — 10 items
COVER MAYO STAND REUSABLE (DRAPES) ×2 IMPLANT
DRSG TELFA 3X8 NADH STRL (GAUZE/BANDAGES/DRESSINGS) ×2 IMPLANT
GAUZE SPONGE 4X4 12PLY STRL (GAUZE/BANDAGES/DRESSINGS) ×2 IMPLANT
GLOVE BIOGEL PI IND STRL 7.5 (GLOVE) ×2 IMPLANT
INST BIOPSY MAXCORE 18GX25 (NEEDLE) ×2 IMPLANT
KIT TURNOVER CYSTO (KITS) ×2 IMPLANT
MANIFOLD NEPTUNE II (INSTRUMENTS) ×2 IMPLANT
SURGILUBE 2OZ TUBE FLIPTOP (MISCELLANEOUS) ×2 IMPLANT
TRAP FLUID SMOKE EVACUATOR (MISCELLANEOUS) ×2 IMPLANT
WATER STERILE IRR 500ML POUR (IV SOLUTION) ×2 IMPLANT

## 2022-12-20 NOTE — Interval H&P Note (Signed)
History and Physical Interval Note:  12/20/2022 7:26 AM  Jake Hudson  has presented today for surgery, with the diagnosis of ELEVATED PSA.  The various methods of treatment have been discussed with the patient and family. After consideration of risks, benefits and other options for treatment, the patient has consented to  Procedure(s): TRANSRECTAL ULTRASOUND (N/A) PROSTATE BIOPSY (N/A) as a surgical intervention.  The patient's history has been reviewed, patient examined, no change in status, stable for surgery.  I have reviewed the patient's chart and labs.  Questions were answered to the patient's satisfaction.     Candies Palm C Shiasia Porro

## 2022-12-20 NOTE — H&P (Signed)
Urology H&P   History of Present Illness: Jake Hudson is a 82 y.o. with a history of an elevated PSA.  Prior prostate biopsy 08/21/2020 for PSA of 7.4 with pathology showing atypical glands right lateral base.  Recent uncorrected PSA on finasteride was 5.5 (equivalent 11.0) which was 5.7 on repeat.  Prostate MRI unable to be performed secondary to bilateral hip replacement.  He presents for TRUS/biopsy of prostate end quested under sedation  Past Medical History:  Diagnosis Date   Aortic atherosclerosis    Arthritis    Asthma    Benign prostatic hyperplasia with elevated prostate specific antigen (PSA)    Bilateral lower extremity edema    CAD (coronary artery disease)    a.) 5v CABG 2014; b.) LHC 07/08/2020: 100% p-mLCx, 100% oLAD, 100% OM1, 100% p-mRCA, 70% o-pRCA, 70% D1; LIMA-LAD patent, SVG-PDA patent, SVG-OM 2 patent, SVG-D1 with 70% stenosis in the native vessel, SVG-OM1 occluded --> med mgmt.   CKD (chronic kidney disease), stage III    Depression    Diastolic dysfunction    Erectile dysfunction    a.) on PDE5i (tadalafil)   Family history of adverse reaction to anesthesia    a.) delayed emergence in 1st degree relative (father)   GERD (gastroesophageal reflux disease)    History of bilateral cataract extraction 2023   History of kidney stones    Hyperlipidemia    Hypertension    Lumbar radiculopathy    Memory impairment    OSA (obstructive sleep apnea)    a.) unable to tolerate mask required for nocturnal PAP therapy   Osteoarthritis    PAF (paroxysmal atrial fibrillation)    a.) CHA2DS2VASc = 5 (age x 2, HTN, vascular disease history, T2DM); b.) rate/rhythm maintained on oral metoprolol succinate; no chronic anticoagulation   PVC's (premature ventricular contractions)    Right inguinal hernia    S/P CABG x 5 2014   a.) done while living on [place]; LIMA-LAD, reverse SVG-diagonal, reverse SVG-OM1, reverse SVG-OM2, reverse SVG-PDA; procedure complicated  by postoperative atrial fibrillation   T2DM (type 2 diabetes mellitus)    Umbilical hernia     Past Surgical History:  Procedure Laterality Date   BACK SURGERY     CARPAL TUNNEL RELEASE     CATARACT EXTRACTION W/PHACO Left 08/08/2022   Procedure: CATARACT EXTRACTION PHACO AND INTRAOCULAR LENS PLACEMENT (IOC) LEFT DIABETIC CLARION VIVITY TORIC LENS;  Surgeon: Nevada Crane, MD;  Location: [facility];  Service: Ophthalmology;  Laterality: Left;  7.19 0:45.8   CATARACT EXTRACTION W/PHACO Right 08/22/2022   Procedure: CATARACT EXTRACTION PHACO AND INTRAOCULAR LENS PLACEMENT (IOC) RIGHT DIABETIC CLAREON VIVITY LENS  4.60  00:36.0;  Surgeon: Nevada Crane, MD;  Location: [facility];  Service: Ophthalmology;  Laterality: Right;  Diabetic   CHOLECYSTECTOMY     CORONARY ARTERY BYPASS GRAFT N/A 2014   Procedure: 5 VESSEL CORONARY ARTERY BYPASS GRAFT; Location: [place]   CYST REMOVAL LEG     CYSTOSCOPY W/ URETEROSCOPY W/ LITHOTRIPSY     2017   HERNIA REPAIR     KIDNEY STONE SURGERY     LEFT HEART CATH AND CORONARY ANGIOGRAPHY Left 07/08/2020   Procedure: LEFT HEART CATH AND CORONARY ANGIOGRAPHY;  Surgeon: Marcina Millard, MD;  Location: [facility] INVASIVE CV LAB;  Service: Cardiovascular;  Laterality: Left;   NASAL SEPTUM SURGERY     PROSTATE BIOPSY     ROTATOR CUFF REPAIR     TONSILLECTOMY     TOTAL HIP ARTHROPLASTY  Bilateral     Home Medications:  Current Meds  Medication Sig   acetaminophen (TYLENOL) 500 MG tablet Take 650 mg by mouth 3 (three) times daily.   busPIRone (BUSPAR) 15 MG tablet Take 15 mg by mouth 2 (two) times daily.   finasteride (PROSCAR) 5 MG tablet TAKE (1) TABLET BY MOUTH EVERY DAY (Patient taking differently: Take 5 mg by mouth at bedtime.)   gabapentin (NEURONTIN) 100 MG capsule Take 500 mg by mouth at bedtime.   glimepiride (AMARYL) 4 MG tablet Take 4 mg by mouth 2 (two) times daily.    hydrochlorothiazide (HYDRODIURIL) 25 MG tablet  Take by mouth.   LYSINE PO Take 3 capsules by mouth daily as needed (immune support).   metoprolol succinate (TOPROL-XL) 25 MG 24 hr tablet Take 12.5 mg by mouth daily.   pantoprazole (PROTONIX) 40 MG tablet Take 40 mg by mouth daily.   potassium chloride (KLOR-CON) 10 MEQ tablet Take by mouth.   tadalafil (CIALIS) 20 MG tablet TAKE ONE TABLET BY MOUTH AS NEEDED 1 HOUR PRIOR TO INTERCOURSE   tamsulosin (FLOMAX) 0.4 MG CAPS capsule Take 0.4 mg by mouth 2 (two) times daily.     Allergies:  Allergies  Allergen Reactions   Rosuvastatin     Muscle Pain   Sulfa Antibiotics Itching    History reviewed. No pertinent family history.  Social History:  reports that he has never smoked. He has never used smokeless tobacco. He reports that he does not currently use alcohol. He reports that he does not use drugs.  ROS: Noncontributory  Physical Exam:  Vital signs in last 24 hours: Temp:  [97.2 F (36.2 C)] 97.2 F (36.2 C) (04/09 0614) Pulse Rate:  [63] 63 (04/09 0614) Resp:  [18] 18 (04/09 0614) BP: (142)/(70) 142/70 (04/09 0614) SpO2:  [97 %] 97 % (04/09 0614) Weight:  [90.7 kg] 90.7 kg (04/09 9509) Constitutional:  Alert and oriented, No acute distress Cardiovascular: Regular rate and rhythm, no clubbing, cyanosis, or edema. Respiratory: Normal respiratory effort, lungs clear bilaterally   Impression/Assessment:  Elevated PSA  Plan:  TRUS/biopsy prostate.  The procedure has been discussed in detail including potential risks of bleeding and infection/sepsis.  All questions were answered and he desires to proceed    12/20/2022, 7:20 AM  Irineo Axon,  MD

## 2022-12-20 NOTE — Anesthesia Preprocedure Evaluation (Signed)
Anesthesia Evaluation  Patient identified by MRN, date of birth, ID band Patient awake    Reviewed: Allergy & Precautions, H&P , NPO status , Patient's Chart, lab work & pertinent test results, reviewed documented beta blocker date and time   Airway Mallampati: III  TM Distance: >3 FB Neck ROM: full    Dental no notable dental hx. (+) Poor Dentition   Pulmonary asthma , sleep apnea and Continuous Positive Airway Pressure Ventilation    Pulmonary exam normal breath sounds clear to auscultation       Cardiovascular Exercise Tolerance: Poor hypertension, On Medications + angina with exertion + CAD  Normal cardiovascular exam Rhythm:regular Rate:Normal     Neuro/Psych  PSYCHIATRIC DISORDERS  Depression     Neuromuscular disease    GI/Hepatic Neg liver ROS,GERD  Medicated,,  Endo/Other  negative endocrine ROSdiabetes    Renal/GU Renal disease  negative genitourinary   Musculoskeletal   Abdominal   Peds  Hematology negative hematology ROS (+)   Anesthesia Other Findings Past Medical History: No date: Aortic atherosclerosis No date: Arthritis No date: Asthma No date: Benign prostatic hyperplasia with elevated prostate specific  antigen (PSA) No date: Bilateral lower extremity edema No date: CAD (coronary artery disease)     Comment:  a.) 5v CABG 2014; b.) LHC 07/08/2020: 100% p-mLCx, 100%               oLAD, 100% OM1, 100% p-mRCA, 70% o-pRCA, 70% D1; LIMA-LAD              patent, SVG-PDA patent, SVG-OM 2 patent, SVG-D1 with 70%               stenosis in the native vessel, SVG-OM1 occluded --> med               mgmt. No date: CKD (chronic kidney disease), stage III No date: Depression No date: Diastolic dysfunction No date: Erectile dysfunction     Comment:  a.) on PDE5i (tadalafil) No date: Family history of adverse reaction to anesthesia     Comment:  a.) delayed emergence in 1st degree relative (father) No  date: GERD (gastroesophageal reflux disease) 2023: History of bilateral cataract extraction No date: History of kidney stones No date: Hyperlipidemia No date: Hypertension No date: Lumbar radiculopathy No date: Memory impairment No date: OSA (obstructive sleep apnea)     Comment:  a.) unable to tolerate mask required for nocturnal PAP               therapy No date: Osteoarthritis No date: PAF (paroxysmal atrial fibrillation)     Comment:  a.) CHA2DS2VASc = 5 (age x 2, HTN, vascular disease               history, T2DM); b.) rate/rhythm maintained on oral               metoprolol succinate; no chronic anticoagulation No date: PVC's (premature ventricular contractions) No date: Right inguinal hernia 2014: S/P CABG x 5     Comment:  a.) done while living on [place]; LIMA-LAD, reverse               SVG-diagonal, reverse SVG-OM1, reverse SVG-OM2, reverse               SVG-PDA; procedure complicated by postoperative atrial               fibrillation No date: T2DM (type 2 diabetes mellitus) No date: Umbilical hernia Past Surgical History: No date: BACK SURGERY  No date: CARPAL TUNNEL RELEASE 08/08/2022: CATARACT EXTRACTION W/PHACO; Left     Comment:  Procedure: CATARACT EXTRACTION PHACO AND INTRAOCULAR               LENS PLACEMENT (IOC) LEFT DIABETIC CLARION VIVITY TORIC               LENS;  Surgeon: Nevada Crane, MD;  Location: [facility];  Service: Ophthalmology;  Laterality: Left;              7.19 0:45.8 08/22/2022: CATARACT EXTRACTION W/PHACO; Right     Comment:  Procedure: CATARACT EXTRACTION PHACO AND INTRAOCULAR               LENS PLACEMENT (IOC) RIGHT DIABETIC CLAREON VIVITY LENS                4.60  00:36.0;  Surgeon: Nevada Crane, MD;                Location: [facility];  Service: Ophthalmology;                Laterality: Right;  Diabetic No date: CHOLECYSTECTOMY 2014: CORONARY ARTERY BYPASS GRAFT; N/A     Comment:  Procedure: 5  VESSEL CORONARY ARTERY BYPASS GRAFT;               Location: [place] No date: CYST REMOVAL LEG No date: CYSTOSCOPY W/ URETEROSCOPY W/ LITHOTRIPSY     Comment:  2017 No date: HERNIA REPAIR No date: KIDNEY STONE SURGERY 07/08/2020: LEFT HEART CATH AND CORONARY ANGIOGRAPHY; Left     Comment:  Procedure: LEFT HEART CATH AND CORONARY ANGIOGRAPHY;                Surgeon: Marcina Millard, MD;  Location: [facility]               INVASIVE CV LAB;  Service: Cardiovascular;  Laterality:               Left; No date: NASAL SEPTUM SURGERY No date: PROSTATE BIOPSY No date: ROTATOR CUFF REPAIR No date: TONSILLECTOMY No date: TOTAL HIP ARTHROPLASTY; Bilateral BMI    Body Mass Index: 30.41 kg/m     Reproductive/Obstetrics negative OB ROS                             Anesthesia Physical Anesthesia Plan  ASA: 3  Anesthesia Plan: General   Post-op Pain Management:    Induction:   PONV Risk Score and Plan: 3  Airway Management Planned:   Additional Equipment:   Intra-op Plan:   Post-operative Plan:   Informed Consent: I have reviewed the patients History and Physical, chart, labs and discussed the procedure including the risks, benefits and alternatives for the proposed anesthesia with the patient or authorized representative who has indicated his/her understanding and acceptance.     Dental Advisory Given  Plan Discussed with: CRNA  Anesthesia Plan Comments:        Anesthesia Quick Evaluation

## 2022-12-20 NOTE — Op Note (Signed)
   Preoperative diagnosis:  Elevated PSA  Postoperative diagnosis:  Same  Procedure: Transrectal ultrasound prostate Transrectal prostate biopsies  Surgeon: [name], MD  Anesthesia: MAC  Complications: None  Intraoperative findings:  Prostate volume 70 cc.  Enlarged transition zone.  No echogenic abnormalities of the peripheral zone noted  EBL: Minimal  Specimens: 12 core template biopsy  Indication: Jake Hudson is a 82 y.o. with a negative prostate biopsy 2021 for a PSA of 7.4.  He was started on finasteride and uncorrected PSA persistently elevated at 5.7 after reviewing the management options for treatment, he elected to proceed with the above surgical procedure(s). We have discussed the potential benefits and risks of the procedure, side effects of the proposed treatment, the likelihood of the patient achieving the goals of the procedure, and any potential problems that might occur during the procedure or recuperation. Informed consent has been obtained.  Description of procedure:  The patient was taken to the operating room was not transferred to the OR table.  The patient was placed in the left lateral decubitus position with knees-to-chest and sedation was obtained by anesthesia. Preoperative antibiotics were administered. A preoperative time-out was performed.   Digital rectal exam was performed and the prostate was smooth without nodules.  Tightness of the anal canal was noted.  A transrectal ultrasound probe was lubricated and gently passed per rectum.  The prostate was imaged and sagittal and transverse planes with findings as described above  Standard 12 core needle biopsies were then performed under ultrasound guidance.  The mid and lateral cores in each site were placed in the same specimen container.  No significant bleeding was noted.  He was then transported to the PACU in stable condition  Plan: Will contact with the pathology results and  further follow-up recommendations at that time   [name], M.D.

## 2022-12-20 NOTE — Transfer of Care (Signed)
Immediate Anesthesia Transfer of Care Note  Patient: Jake Hudson  Procedure(s) Performed: TRANSRECTAL ULTRASOUND (Rectum) PROSTATE BIOPSY (Prostate)  Patient Location: PACU  Anesthesia Type:General  Level of Consciousness: drowsy  Airway & Oxygen Therapy: Patient Spontanous Breathing  Post-op Assessment: Report given to RN and Post -op Vital signs reviewed and stable  Post vital signs: Reviewed and stable  Last Vitals:  Vitals Value Taken Time  BP 107/46 12/20/22 0833  Temp 36.8 C 12/20/22 0824  Pulse 65 12/20/22 0834  Resp 11 12/20/22 0834  SpO2 94 % 12/20/22 0834  Vitals shown include unvalidated device data.  Last Pain:  Vitals:   12/20/22 0824  TempSrc:   PainSc: 0-No pain         Complications: No notable events documented.

## 2022-12-20 NOTE — Discharge Instructions (Addendum)
AMBULATORY SURGERY  DISCHARGE INSTRUCTIONS   The drugs that you were given will stay in your system until tomorrow so for the next 24 hours you should not:  Drive an automobile Make any legal decisions Drink any alcoholic beverage   You may resume regular meals tomorrow.  Today it is better to start with liquids and gradually work up to solid foods.  You may eat anything you prefer, but it is better to start with liquids, then soup and crackers, and gradually work up to solid foods.   Please notify your doctor immediately if you have any unusual bleeding, trouble breathing, redness and pain at the surgery site, drainage, fever, or pain not relieved by medication.    Additional Instructions:  Prostate biopsy discharge instructions  May resume regular activities in 24 hours It is normal to see blood from the rectum and in the urine.  Typically blood from the rectum resolves within 8 hours and blood in the urine can persist intermittently for 2 weeks.  Blood in the semen is expected and can take several weeks to resolve Please contact our office at [phone] or proceed to the ED for excessive bleeding from the rectum or urine; inability to urinate or fever greater than 101 degrees You will be contacted with your biopsy results once they return   Please contact your physician with any problems or Same Day Surgery at [phone], Monday through Friday 6 am to 4 pm, or [place] [facility] number at [phone].

## 2022-12-21 ENCOUNTER — Encounter: Payer: Self-pay | Admitting: Urology

## 2022-12-21 LAB — SURGICAL PATHOLOGY

## 2022-12-21 NOTE — Anesthesia Postprocedure Evaluation (Signed)
Anesthesia Post Note  Patient: Jake Hudson  Procedure(s) Performed: TRANSRECTAL ULTRASOUND (Rectum) PROSTATE BIOPSY (Prostate)  Patient location during evaluation: PACU Anesthesia Type: General Level of consciousness: awake and alert Pain management: pain level controlled Vital Signs Assessment: post-procedure vital signs reviewed and stable Respiratory status: spontaneous breathing, nonlabored ventilation, respiratory function stable and patient connected to nasal cannula oxygen Cardiovascular status: blood pressure returned to baseline and stable Postop Assessment: no apparent nausea or vomiting Anesthetic complications: no   No notable events documented.   Last Vitals:  Vitals:   12/20/22 0858 12/20/22 0908  BP: (!) 126/55 (!) 143/66  Pulse: 62 64  Resp: 15 16  Temp: 37.1 C 36.5 C  SpO2: 96% 97%    Last Pain:  Vitals:   12/20/22 0908  TempSrc: Temporal  PainSc: 0-No pain                 Yevette Edwards

## 2022-12-23 ENCOUNTER — Telehealth: Payer: Self-pay | Admitting: Urology

## 2022-12-23 DIAGNOSIS — C61 Malignant neoplasm of prostate: Secondary | ICD-10-CM

## 2022-12-23 NOTE — Telephone Encounter (Signed)
I contacted Mr. Elsaesser to discuss his prostate biopsy report.  He had no postbiopsy complaints.  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  Discussed his NCCN risk stratification as intermediate unfavorable.  He was unable to have prostate MRIs secondary to bilateral hip replacements and will schedule CT abdomen pelvis with contrast for staging.  If no evidence of extracapsular disease we discussed options of radiation therapy + ADT versus observation as his life expectancy based on so security tables is 9.4 years.  CT order was entered and he will be notified with the results

## 2022-12-24 NOTE — Telephone Encounter (Signed)
error 

## 2022-12-28 ENCOUNTER — Ambulatory Visit
Admission: RE | Admit: 2022-12-28 | Discharge: 2022-12-28 | Disposition: A | Discharge status: Home / Self Care | Payer: Medicare Other | Source: Ambulatory Visit | Attending: Urology | Admitting: Urology

## 2022-12-28 DIAGNOSIS — C61 Malignant neoplasm of prostate: Secondary | ICD-10-CM | POA: Insufficient documentation

## 2022-12-28 MED ORDER — IOHEXOL 300 MG/ML  SOLN
100.0000 mL | Freq: Once | INTRAMUSCULAR | Status: AC | PRN
  Administered 2022-12-28: 100 mL via INTRAVENOUS

## 2023-01-01 ENCOUNTER — Encounter: Payer: Self-pay | Admitting: Urology

## 2023-01-06 ENCOUNTER — Other Ambulatory Visit: Payer: Self-pay | Admitting: Urology

## 2023-01-06 DIAGNOSIS — C61 Malignant neoplasm of prostate: Secondary | ICD-10-CM

## 2023-01-17 ENCOUNTER — Encounter: Payer: Self-pay | Admitting: Radiation Oncology

## 2023-01-17 ENCOUNTER — Ambulatory Visit
Admission: RE | Admit: 2023-01-17 | Discharge: 2023-01-17 | Disposition: A | Discharge status: Home / Self Care | Payer: Medicare Other | Source: Ambulatory Visit | Attending: Radiation Oncology | Admitting: Radiation Oncology

## 2023-01-17 VITALS — BP 156/77 | HR 66 | Temp 95.8°F | Resp 16 | Ht 68.0 in | Wt 200.0 lb

## 2023-01-17 DIAGNOSIS — C61 Malignant neoplasm of prostate: Secondary | ICD-10-CM | POA: Insufficient documentation

## 2023-01-17 NOTE — Consult Note (Signed)
NEW PATIENT EVALUATION  Name: Jake Hudson  MRN: [id number]  Date:   01/17/2023     DOB: [birth year]   This 82 y.o. 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  REFERRING PHYSICIAN: [name], MD  CHIEF COMPLAINT:  Chief Complaint  Patient presents with   Prostate Cancer    Consult    DIAGNOSIS: The encounter diagnosis was Malignant neoplasm of prostate (HCC).   PREVIOUS INVESTIGATIONS:  CT scan reviewed Clinical notes reviewed Pathology reports reviewed  HPI: Patient is a 82 year old male whose history dates back to 72 when he presented with an elevated PSA of 7.4.  Biopsy at that time showed only atypical glands.  He was placed on finasteride and recently his PSA was 5.7.  Prostate MRI unable to be performed secondary to bilateral hip replacements.  He underwent transrectal ultrasound-guided biopsies showing 2 of 6 cores positive for adenocarcinoma highest grade Gleason 7 (4+3).  He had a CT scan of his pelvis showing mild to moderate enlarged prostate approximately 70 cc no evidence of metastatic disease within the abdomen or pelvis.  He is now referred to radiation oncology for opinion he is clinically doing fairly well does have some nocturia and urinary frequency.  Also was having problems with diarrhea secondary to metformin although he is discontinued that medication with improvement in those symptoms.  PLANNED TREATMENT REGIMEN: Image guided IMRT radiation therapy plus ADT therapy  PAST MEDICAL HISTORY:  has a past medical history of Aortic atherosclerosis (HCC), Arthritis, Asthma, Benign prostatic hyperplasia with elevated prostate specific antigen (PSA), Bilateral lower extremity edema, CAD (coronary artery disease), CKD (chronic kidney disease), stage III (HCC), Depression, Diastolic dysfunction, Erectile dysfunction, Family history of  adverse reaction to anesthesia, GERD (gastroesophageal reflux disease), History of bilateral cataract extraction (2023), History of kidney stones, Hyperlipidemia, Hypertension, Lumbar radiculopathy, Memory impairment, OSA (obstructive sleep apnea), Osteoarthritis, PAF (paroxysmal atrial fibrillation) (HCC), PVC's (premature ventricular contractions), Right inguinal hernia, S/P CABG x 5 (2014), T2DM (type 2 diabetes mellitus) (HCC), and Umbilical hernia.    PAST SURGICAL HISTORY:  Past Surgical History:  Procedure Laterality Date   BACK SURGERY     CARPAL TUNNEL RELEASE     CATARACT EXTRACTION W/PHACO Left 08/08/2022   Procedure: CATARACT EXTRACTION PHACO AND INTRAOCULAR LENS PLACEMENT (IOC) LEFT DIABETIC CLARION VIVITY TORIC LENS;  Surgeon: Nevada Crane, MD;  Location: [facility];  Service: Ophthalmology;  Laterality: Left;  7.19 0:45.8   CATARACT EXTRACTION W/PHACO Right 08/22/2022   Procedure: CATARACT EXTRACTION PHACO AND INTRAOCULAR LENS PLACEMENT (IOC) RIGHT DIABETIC CLAREON VIVITY LENS  4.60  00:36.0;  Surgeon: Nevada Crane, MD;  Location: [facility];  Service: Ophthalmology;  Laterality: Right;  Diabetic   CHOLECYSTECTOMY     CORONARY ARTERY BYPASS GRAFT N/A 2014   Procedure: 5 VESSEL CORONARY ARTERY BYPASS GRAFT; Location: [place]   CYST REMOVAL LEG     CYSTOSCOPY W/ URETEROSCOPY W/ LITHOTRIPSY     2017   HERNIA REPAIR     KIDNEY STONE SURGERY     LEFT HEART CATH AND CORONARY ANGIOGRAPHY Left 07/08/2020   Procedure: LEFT HEART CATH AND CORONARY ANGIOGRAPHY;  Surgeon: Marcina Millard, MD;  Location: [facility] IN[facility];  Service: Cardiovascular;  Laterality: Left;   NASAL SEPTUM SURGERY     PROSTATE BIOPSY     PROSTATE BIOPSY N/A 12/20/2022   Procedure: PROSTATE  BIOPSY;  Surgeon: [name], MD;  Location: [facility] OR[facility];  Service: Urology;  Laterality: N/A;   ROTATOR CUFF REPAIR     TONSILLECTOMY     TOTAL HIP ARTHROPLASTY Bilateral     TRANSRECTAL ULTRASOUND N/A 12/20/2022   Procedure: TRANSRECTAL ULTRASOUND;  Surgeon: [name], MD;  Location: [facility] OR[facility];  Service: Urology;  Laterality: N/A;    FAMILY HISTORY: family history is not on file.  SOCIAL HISTORY:  reports that he has never smoked. He has never used smokeless tobacco. He reports that he does not currently use alcohol. He reports that he does not use drugs.  ALLERGIES: Rosuvastatin and Sulfa antibiotics  MEDICATIONS:  Current Outpatient Medications  Medication Sig Dispense Refill   acetaminophen (TYLENOL) 500 MG tablet Take 650 mg by mouth 3 (three) times daily.     busPIRone (BUSPAR) 15 MG tablet Take 15 mg by mouth 2 (two) times daily.     cholecalciferol (VITAMIN D3) 25 MCG (1000 UNIT) tablet Take 1,000 Units by mouth daily.     Dulaglutide (TRULICITY) 3 MG/0.5ML SOPN Inject 3 mg into the skin every 7 (seven) days.     finasteride (PROSCAR) 5 MG tablet TAKE (1) TABLET BY MOUTH EVERY DAY (Patient taking differently: Take 5 mg by mouth at bedtime.) 90 tablet 3   gabapentin (NEURONTIN) 100 MG capsule Take 500 mg by mouth at bedtime.     glimepiride (AMARYL) 4 MG tablet Take 4 mg by mouth 2 (two) times daily.      glucose blood test strip USE 3 TIMES DAILY     hydrochlorothiazide (HYDRODIURIL) 25 MG tablet Take by mouth.     LYSINE PO Take 3 capsules by mouth daily as needed (immune support).     metFORMIN (GLUCOPHAGE) 500 MG tablet Take 500 mg by mouth 2 (two) times daily.     metoprolol succinate (TOPROL-XL) 25 MG 24 hr tablet Take 12.5 mg by mouth daily.     Misc Natural Products (PROSTATE HEALTH PO) Take 1 tablet by mouth in the morning and at bedtime.      pantoprazole (PROTONIX) 40 MG tablet Take 40 mg by mouth daily.     potassium chloride (KLOR-CON) 10 MEQ tablet Take by mouth.     tadalafil (CIALIS) 20 MG tablet TAKE ONE TABLET BY MOUTH AS NEEDED 1 HOUR PRIOR TO INTERCOURSE 30 tablet 3   tamsulosin (FLOMAX) 0.4 MG CAPS capsule Take 0.4 mg by mouth  2 (two) times daily.      No current facility-administered medications for this encounter.    ECOG PERFORMANCE STATUS:  0 - Asymptomatic  REVIEW OF SYSTEMS: Patient denies any weight loss, fatigue, weakness, fever, chills or night sweats. Patient denies any loss of vision, blurred vision. Patient denies any ringing  of the ears or hearing loss. No irregular heartbeat. Patient denies heart murmur or history of fainting. Patient denies any chest pain or pain radiating to her upper extremities. Patient denies any shortness of breath, difficulty breathing at night, cough or hemoptysis. Patient denies any swelling in the lower legs. Patient denies any nausea vomiting, vomiting of blood, or coffee ground material in the vomitus. Patient denies any stomach pain. Patient states has had normal bowel movements no significant constipation or diarrhea. Patient denies any dysuria, hematuria or significant nocturia. Patient denies any problems walking, swelling in the joints or loss of balance. Patient denies any skin changes, loss of hair or loss of weight. Patient denies any excessive worrying or anxiety or  significant depression. Patient denies any problems with insomnia. Patient denies excessive thirst, polyuria, polydipsia. Patient denies any swollen glands, patient denies easy bruising or easy bleeding. Patient denies any recent infections, allergies or URI. Patient "s visual fields have not changed significantly in recent time.   PHYSICAL EXAM: BP (!) 156/77   Pulse 66   Temp (!) 95.8 F (35.4 C)   Resp 16   Ht 5\' 8"  (1.727 m)   Wt 200 lb (90.7 kg)   BMI 30.41 kg/m  Well-developed well-nourished patient in NAD. HEENT reveals PERLA, EOMI, discs not visualized.  Oral cavity is clear. No oral mucosal lesions are identified. Neck is clear without evidence of cervical or supraclavicular adenopathy. Lungs are clear to A&P. Cardiac examination is essentially unremarkable with regular rate and rhythm without  murmur rub or thrill. Abdomen is benign with no organomegaly or masses noted. Motor sensory and DTR levels are equal and symmetric in the upper and lower extremities. Cranial nerves II through XII are grossly intact. Proprioception is intact. No peripheral adenopathy or edema is identified. No motor or sensory levels are noted. Crude visual fields are within normal range.  LABORATORY DATA: Pathology reports reviewed    RADIOLOGY RESULTS: CT scan reviewed compatible with above-stated findings   IMPRESSION: Stage IIc Gleason 7 adenocarcinoma the prostate presenting with a PSA of 5.17 and an 82 year old male  PLAN: At this time I have recommended image guided IMRT radiation therapy to his prostate.  Based on his Gleason 7 (4+3) and PSA of 5.7 on finasteride believe he has significant reason for treatment at this time.  I have run the [facility] nomogram showing approximate 15% chance of lymph node involvement as well as a 62% chance of extracapsular extension.  Would plan on delivering 80 Gray over 8 weeks.  I have asked Dr. Lonna Cobb to place fiducial markers in his prostate for daily image guided treatment.  I would also like the patient to have 1 shot of Eligard 71-month depot along with his radiation.  Will set him up for simulation once those 2 things have been performed.  Risks and benefits of treatment including increased lower urinary tract symptoms diarrhea fatigue alteration blood counts all were reviewed in detail with the patient.  He comprehends my treatment plan well.  I would like to take this opportunity to thank you for allowing me to participate in the care of your patient.[name], MD

## 2023-01-19 ENCOUNTER — Telehealth: Payer: Self-pay

## 2023-01-19 NOTE — Telephone Encounter (Signed)
-----   Message from Dorcas Carrow, RN sent at 01/17/2023 10:29 AM EDT ----- Regarding: Markers and Eligard Good Morning,   This patient will need both Eligard and Markers placed.  He will bring the Markers with him.   Thanks, Lazarus Gowda

## 2023-01-19 NOTE — Telephone Encounter (Signed)
[facility] for an appt.

## 2023-01-19 NOTE — Telephone Encounter (Signed)
Called [employer], no prior auth required for Eligard. Pt ready for scheduling.

## 2023-01-27 ENCOUNTER — Other Ambulatory Visit: Payer: Self-pay | Admitting: Family Medicine

## 2023-01-27 DIAGNOSIS — R5383 Other fatigue: Secondary | ICD-10-CM

## 2023-01-30 ENCOUNTER — Other Ambulatory Visit: Payer: Self-pay | Admitting: Urology

## 2023-01-30 ENCOUNTER — Other Ambulatory Visit: Payer: Medicare Other

## 2023-01-30 DIAGNOSIS — R5383 Other fatigue: Secondary | ICD-10-CM

## 2023-01-31 LAB — TESTOSTERONE: Testosterone: 357 ng/dL (ref 264–916)

## 2023-02-02 ENCOUNTER — Encounter: Payer: Self-pay | Admitting: Urology

## 2023-02-02 ENCOUNTER — Ambulatory Visit (INDEPENDENT_AMBULATORY_CARE_PROVIDER_SITE_OTHER): Payer: Medicare Other | Admitting: Urology

## 2023-02-02 VITALS — BP 138/70 | HR 74 | Ht 68.0 in | Wt 195.0 lb

## 2023-02-02 DIAGNOSIS — Z2989 Encounter for other specified prophylactic measures: Secondary | ICD-10-CM

## 2023-02-02 DIAGNOSIS — R972 Elevated prostate specific antigen [PSA]: Secondary | ICD-10-CM

## 2023-02-02 DIAGNOSIS — C61 Malignant neoplasm of prostate: Secondary | ICD-10-CM

## 2023-02-02 MED ORDER — LEVOFLOXACIN 500 MG PO TABS
500.0000 mg | ORAL_TABLET | Freq: Once | ORAL | Status: AC
Start: 2023-02-02 — End: 2023-02-02
  Administered 2023-02-02: 500 mg via ORAL

## 2023-02-02 MED ORDER — GENTAMICIN SULFATE 40 MG/ML IJ SOLN
80.0000 mg | Freq: Once | INTRAMUSCULAR | Status: AC
Start: 2023-02-02 — End: 2023-02-02
  Administered 2023-02-02: 80 mg via INTRAMUSCULAR

## 2023-02-02 MED ORDER — LEUPROLIDE ACETATE (6 MONTH) 45 MG ~~LOC~~ KIT
45.0000 mg | PACK | Freq: Once | SUBCUTANEOUS | Status: AC
Start: 2023-02-02 — End: 2023-02-02
  Administered 2023-02-02: 45 mg via SUBCUTANEOUS

## 2023-02-02 NOTE — Patient Instructions (Signed)
Vitamin D 800-1000iu and Calcium 1000-1200mg daily while on Androgen Deprivation Therapy.  

## 2023-02-02 NOTE — Progress Notes (Signed)
Eligard SubQ Injection   Due to Prostate Cancer patient is present today for a Eligard Injection.  Medication: Eligard 6 month Dose: 45 mg  Location: left  Lot: [id number] Exp: 01/2024  Patient tolerated well, no complications were noted  Performed by: Teressa Lower, CMA  Per Dr. Aggie Cosier patient is to continue therapy for 6 months. This appointment was scheduled using wheel and given to patient today along with reminder continue on Vitamin D 800-1000iu and Calcium 1000-1200mg  daily while on Androgen Deprivation Therapy.  PA approval dates:

## 2023-02-03 NOTE — Progress Notes (Signed)
02/03/23  CC: gold fiducial marker placement  HPI: 82 y.o. male with prostate cancer who presents today for placement of fiducial seed markers in anticipation of his upcoming IMRT with Dr. Rushie Chestnut.  Prostate Gold fiducial Marker Placement Procedure   Informed consent was obtained after discussing risks/benefits of the procedure.  A time out was performed to ensure correct patient identity.  Pre-Procedure: - Gentamicin given prophylactically - PO Levaquin 500 mg also given today  Procedure: - Lidocaine jelly was administered per rectum - Rectal ultrasound probe was placed without difficulty and the prostate visualized - Prostatic block performed with 10 mL 1% Xylocaine - 3 fiducial gold seed markers placed, one at right base, one at left base, one at apex of prostate gland under transrectal ultrasound guidance  Post-Procedure: - Patient tolerated the procedure well - He was counseled to seek immediate medical attention if experiences any severe pain, significant bleeding, or fevers    Irineo Axon, MD

## 2023-02-07 ENCOUNTER — Ambulatory Visit
Admission: RE | Admit: 2023-02-07 | Discharge: 2023-02-07 | Disposition: A | Discharge status: Home / Self Care | Payer: Medicare Other | Source: Ambulatory Visit | Attending: Radiation Oncology | Admitting: Radiation Oncology

## 2023-02-07 DIAGNOSIS — C61 Malignant neoplasm of prostate: Secondary | ICD-10-CM | POA: Diagnosis present

## 2023-02-08 DIAGNOSIS — C61 Malignant neoplasm of prostate: Secondary | ICD-10-CM | POA: Diagnosis not present

## 2023-02-10 ENCOUNTER — Other Ambulatory Visit: Payer: Self-pay | Admitting: *Deleted

## 2023-02-10 DIAGNOSIS — C61 Malignant neoplasm of prostate: Secondary | ICD-10-CM

## 2023-02-13 ENCOUNTER — Ambulatory Visit
Admission: EM | Admit: 2023-02-13 | Discharge: 2023-02-13 | Disposition: A | Discharge status: Home / Self Care | Payer: Medicare Other | Attending: Nurse Practitioner | Admitting: Nurse Practitioner

## 2023-02-13 DIAGNOSIS — H6991 Unspecified Eustachian tube disorder, right ear: Secondary | ICD-10-CM | POA: Diagnosis not present

## 2023-02-13 DIAGNOSIS — B9789 Other viral agents as the cause of diseases classified elsewhere: Secondary | ICD-10-CM

## 2023-02-13 DIAGNOSIS — J329 Chronic sinusitis, unspecified: Secondary | ICD-10-CM | POA: Diagnosis not present

## 2023-02-13 MED ORDER — FLUTICASONE PROPIONATE 50 MCG/ACT NA SUSP
1.0000 | Freq: Every day | NASAL | 0 refills | Status: AC
Start: 2023-02-13 — End: ?

## 2023-02-13 NOTE — ED Provider Notes (Signed)
[facility]    CSN: [id number] Arrival date & time: 02/13/23  1340      History   Chief Complaint Chief Complaint  Patient presents with   Facial Pain   Dizziness    HPI Jake Hudson is a 82 y.o. male presents for evaluation of sinus pain.  Patient reports 2 days of right-sided sinus pain/pressure with some right ear pain, and vertigo type symptoms.  He denies any fevers, cough, sore throat, body aches, shortness of breath.  No asthma or smoking history.  He does have a history of diabetes and prostate cancer currently undergoing treatment.  He does report a history of dizziness that has worsened since he started Eligard several months ago.  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  Denies any worsening dizziness since his sinus symptoms began.  He has been using nasal rinses which do help with symptoms.  No other concerns at this time   Dizziness   Past Medical History:  Diagnosis Date   Aortic atherosclerosis (HCC)    Arthritis    Asthma    Benign prostatic hyperplasia with elevated prostate specific antigen (PSA)    Bilateral lower extremity edema    CAD (coronary artery disease)    a.) 5v CABG 2014; b.) LHC 07/08/2020: 100% p-mLCx, 100% oLAD, 100% OM1, 100% p-mRCA, 70% o-pRCA, 70% D1; LIMA-LAD patent, SVG-PDA patent, SVG-OM 2 patent, SVG-D1 with 70% stenosis in the native vessel, SVG-OM1 occluded --> med mgmt.   CKD (chronic kidney disease), stage III (HCC)    Depression    Diastolic dysfunction    Erectile dysfunction    a.) on PDE5i (tadalafil)   Family history of adverse reaction to anesthesia    a.) delayed emergence in 1st degree relative (father)   GERD (gastroesophageal reflux disease)    History of bilateral cataract extraction 2023   History of kidney stones    Hyperlipidemia    Hypertension    Lumbar radiculopathy    Memory impairment    OSA (obstructive sleep apnea)    a.) unable to tolerate mask required for nocturnal PAP therapy    Osteoarthritis    PAF (paroxysmal atrial fibrillation) (HCC)    a.) CHA2DS2VASc = 5 (age x 2, HTN, vascular disease history, T2DM); b.) rate/rhythm maintained on oral metoprolol succinate; no chronic anticoagulation   PVC's (premature ventricular contractions)    Right inguinal hernia    S/P CABG x 5 2014   a.) done while living on [place]; LIMA-LAD, reverse SVG-diagonal, reverse SVG-OM1, reverse SVG-OM2, reverse SVG-PDA; procedure complicated by postoperative atrial fibrillation   T2DM (type 2 diabetes mellitus) (HCC)    Umbilical hernia     Patient Active Problem List   Diagnosis Date Noted   Cardiovascular stress test abnormal 04/05/2019   Electrocardiogram abnormal 04/05/2019   Pleural effusion 04/05/2019   Ventricular premature beats 04/05/2019   Elevated PSA 09/21/2018   Erectile dysfunction due to arterial insufficiency 09/21/2018   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 08/07/2018   Angina pectoris (HCC) 06/14/2018   Atrial fibrillation (HCC) 06/14/2018   Benign prostatic hyperplasia with lower urinary tract symptoms 06/14/2018   CKD (chronic kidney disease) stage 3, GFR 30-59 ml/min (HCC) 06/14/2018   Coronary arteriosclerosis 06/14/2018   DM (diabetes mellitus), type 2 (HCC) 06/14/2018   Fatigue 06/14/2018   Essential hypertension 06/14/2018   Hyperlipidemia 06/14/2018   Mitral valve regurgitation 06/14/2018   Obesity (BMI 30-39.9) 06/14/2018   Obstructive sleep apnea syndrome 06/14/2018   Ventricular premature complex  06/14/2018   Primary osteoarthritis of right hip 04/04/2018   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 04/03/2018    Past Surgical History:  Procedure Laterality Date   BACK SURGERY     CARPAL TUNNEL RELEASE     CATARACT EXTRACTION W/PHACO Left 08/08/2022   Procedure: CATARACT EXTRACTION PHACO AND INTRAOCULAR LENS PLACEMENT (IOC) LEFT DIABETIC CLARION VIVITY TORIC LENS;  Surgeon: Nevada Crane, MD;  Location: [facility];  Service: Ophthalmology;  Laterality: Left;  7.19 0:45.8   CATARACT EXTRACTION W/PHACO Right 08/22/2022   Procedure: CATARACT EXTRACTION PHACO AND INTRAOCULAR LENS PLACEMENT (IOC) RIGHT DIABETIC CLAREON VIVITY LENS  4.60  00:36.0;  Surgeon: Nevada Crane, MD;  Location: [facility];  Service: Ophthalmology;  Laterality: Right;  Diabetic   CHOLECYSTECTOMY     CORONARY ARTERY BYPASS GRAFT N/A 2014   Procedure: 5 VESSEL CORONARY ARTERY BYPASS GRAFT; Location: [place]   CYST REMOVAL LEG     CYSTOSCOPY W/ URETEROSCOPY W/ LITHOTRIPSY     2017   HERNIA REPAIR     KIDNEY STONE SURGERY     LEFT HEART CATH AND CORONARY ANGIOGRAPHY Left 07/08/2020   Procedure: LEFT HEART CATH AND CORONARY ANGIOGRAPHY;  Surgeon: Marcina Millard, MD;  Location: [facility];  Service: Cardiovascular;  Laterality: Left;   NASAL SEPTUM SURGERY     PROSTATE BIOPSY     PROSTATE BIOPSY N/A 12/20/2022   Procedure: PROSTATE BIOPSY;  Surgeon: [name], MD;  Location: [facility] OR[facility];  Service: Urology;  Laterality: N/A;   ROTATOR CUFF REPAIR     TONSILLECTOMY     TOTAL HIP ARTHROPLASTY Bilateral    TRANSRECTAL ULTRASOUND N/A 12/20/2022   Procedure: TRANSRECTAL ULTRASOUND;  Surgeon: [name], MD;  Location: [facility] OR[facility];  Service: Urology;  Laterality: N/A;       Home Medications    Prior to Admission medications   Medication Sig Start Date End Date Taking? Authorizing Provider  fluticasone (FLONASE) 50 MCG/ACT nasal spray Place 1 spray into both nostrils daily. 02/13/23  Yes Radford Pax, NP  acetaminophen (TYLENOL) 500 MG tablet Take 650 mg by mouth 3 (three) times daily.    [provider]  busPIRone (BUSPAR) 15 MG tablet Take 15 mg by mouth 2 (two) times daily.    [provider]  cholecalciferol (VITAMIN D3) 25 MCG (1000 UNIT) tablet Take 1,000 Units by mouth daily.    [provider]  Dulaglutide (TRULICITY) 3 MG/0.5ML SOPN Inject 3 mg into the  skin every 7 (seven) days.    [provider]  finasteride (PROSCAR) 5 MG tablet TAKE (1) TABLET BY MOUTH EVERY DAY Patient taking differently: Take 5 mg by mouth at bedtime. 09/21/22   Stoioff, Verna Czech, MD  gabapentin (NEURONTIN) 100 MG capsule Take 500 mg by mouth at bedtime. 09/03/18   [provider]  glimepiride (AMARYL) 4 MG tablet Take 4 mg by mouth 2 (two) times daily.  06/09/18   [provider]  glucose blood test strip USE 3 TIMES DAILY 01/25/19   [provider]  hydrochlorothiazide (HYDRODIURIL) 25 MG tablet Take by mouth. 07/24/20   [provider]  LYSINE PO Take 3 capsules by mouth daily as needed (immune support).    [provider]  metFORMIN (GLUCOPHAGE) 500 MG tablet Take 500 mg by mouth 2 (two) times daily.    [provider]  metoprolol succinate (TOPROL-XL) 25 MG 24 hr tablet Take 12.5 mg by mouth  daily. 03/14/19   [provider]  Misc Natural Products (PROSTATE HEALTH PO) Take 1 tablet by mouth in the morning and at bedtime.     [provider]  MOUNJARO 15 MG/0.5ML Pen SMARTSIG:15 Milligram(s) SUB-Q Once a Week    [provider]  pantoprazole (PROTONIX) 40 MG tablet Take 40 mg by mouth daily.    [provider]  potassium chloride (KLOR-CON) 10 MEQ tablet Take by mouth. 07/24/20 12/20/22  [provider]  tadalafil (CIALIS) 20 MG tablet TAKE ONE TABLET BY MOUTH AS NEEDED 1 HOUR PRIOR TO INTERCOURSE 08/24/22   Stoioff, Verna Czech, MD  tamsulosin (FLOMAX) 0.4 MG CAPS capsule Take 0.4 mg by mouth 2 (two) times daily.  09/03/18   [provider]    Family History No family history on file.  Social History Social History   Tobacco Use   Smoking status: Never   Smokeless tobacco: Never  Vaping Use   Vaping Use: Never used  Substance Use Topics   Alcohol use: Not Currently   Drug use: Never     Allergies   Rosuvastatin and Sulfa antibiotics   Review of  Systems Review of Systems  HENT:  Positive for congestion, sinus pressure and sinus pain.   Neurological:  Positive for dizziness.     Physical Exam Triage Vital Signs ED Triage Vitals  Enc Vitals Group     BP 02/13/23 1512 (!) 154/82     Pulse Rate 02/13/23 1512 72     Resp 02/13/23 1512 18     Temp 02/13/23 1512 98.4 F (36.9 C)     Temp Source 02/13/23 1512 Oral     SpO2 02/13/23 1512 95 %     Weight --      Height --      Head Circumference --      Peak Flow --      Pain Score 02/13/23 1506 4     Pain Loc --      Pain Edu? --      Excl. in GC? --    No data found.  Updated Vital Signs BP (!) 154/82 (BP Location: Right Arm)   Pulse 72   Temp 98.4 F (36.9 C) (Oral)   Resp 18   SpO2 95%   Visual Acuity Right Eye Distance:   Left Eye Distance:   Bilateral Distance:    Right Eye Near:   Left Eye Near:    Bilateral Near:     Physical Exam Vitals and nursing note reviewed.  Constitutional:      General: He is not in acute distress.    Appearance: Normal appearance. He is not ill-appearing or toxic-appearing.  HENT:     Head: Normocephalic and atraumatic.     Right Ear: Ear canal normal. A middle ear effusion is present. Tympanic membrane is not erythematous.     Left Ear: Tympanic membrane and ear canal normal.     Nose: Congestion present.     Right Turbinates: Not swollen or pale.     Left Turbinates: Not swollen or pale.     Right Sinus: No maxillary sinus tenderness or frontal sinus tenderness.     Left Sinus: No maxillary sinus tenderness or frontal sinus tenderness.     Mouth/Throat:     Mouth: Mucous membranes are moist.     Pharynx: No oropharyngeal exudate or posterior oropharyngeal erythema.  Eyes:     Pupils: Pupils are equal, round, and reactive to light.  Cardiovascular:     Rate and Rhythm: Normal rate and regular rhythm.     Heart sounds: Normal heart sounds.  Pulmonary:     Effort: Pulmonary effort is normal.     Breath sounds:  Normal breath sounds.  Musculoskeletal:     Cervical back: Normal range of motion and neck supple.  Lymphadenopathy:     Cervical: No cervical adenopathy.  Skin:    General: Skin is warm and dry.  Neurological:     General: No focal deficit present.     Mental Status: He is alert and oriented to person, place, and time.  Psychiatric:        Mood and Affect: Mood normal.        Behavior: Behavior normal.      UC Treatments / Results  Labs (all labs ordered are listed, but only abnormal results are displayed) Labs Reviewed - No data to display  EKG   Radiology No results found.  Procedures Procedures (including critical care time)  Medications Ordered in UC Medications - No data to display  Initial Impression / Assessment and Plan / UC Course  I have reviewed the triage vital signs and the nursing notes.  Pertinent labs & imaging results that were available during my care of the patient were reviewed by me and considered in my medical decision making (see chart for details).     Reviewed exam and symptoms with patient.  No red flags. Discussed likely viral sinusitis.  Does report a history of vertigo.  Discussed eustachian tube dysfunction could be exacerbating this as well as the medication he is currently on for his prostate cancer.  Do not feel emergent workup is indicated at this time Trial of Flonase Continue nasal rinses/Nettie pot as needed Follow-up with PCP in 2 days for recheck ER precautions reviewed and patient verbalized understanding Final Clinical Impressions(s) / UC Diagnoses   Final diagnoses:  Viral sinusitis  Dysfunction of right eustachian tube     Discharge Instructions      Your exam and symptoms are consistent with a viral sinus Start Flonase daily Continue nasal rinses/Nettie Potts as needed Follow-up with your PCP if your symptoms do not improve Please go to the ER for any worsening symptoms   ED Prescriptions     Medication  Sig Dispense Auth. Provider   fluticasone (FLONASE) 50 MCG/ACT nasal spray Place 1 spray into both nostrils daily. 15.8 mL Radford Pax, NP      PDMP not reviewed this encounter.   Radford Pax, NP 02/13/23 [phone]

## 2023-02-13 NOTE — Discharge Instructions (Signed)
Your exam and symptoms are consistent with a viral sinus Start Flonase daily Continue nasal rinses/Nettie Potts as needed Follow-up with your PCP if your symptoms do not improve Please go to the ER for any worsening symptoms

## 2023-02-13 NOTE — ED Triage Notes (Signed)
Pt reports dizziness and sinus pressure two days ago. Used nasal rinse and had improvement. S/s are returning and did another nasal rinse this morning. Unable to clarify when dizziness started but did start a new med in May (Eligard) and dizziness is a side effect.   Reports funny feeling in R ear. No pain.

## 2023-02-15 ENCOUNTER — Ambulatory Visit
Admission: RE | Admit: 2023-02-15 | Discharge: 2023-02-15 | Disposition: A | Discharge status: Home / Self Care | Payer: Medicare Other | Source: Ambulatory Visit | Attending: Radiation Oncology | Admitting: Radiation Oncology

## 2023-02-15 DIAGNOSIS — C61 Malignant neoplasm of prostate: Secondary | ICD-10-CM | POA: Insufficient documentation

## 2023-02-16 ENCOUNTER — Ambulatory Visit
Admission: EM | Admit: 2023-02-16 | Discharge: 2023-02-16 | Disposition: A | Discharge status: Home / Self Care | Payer: Medicare Other | Attending: Emergency Medicine | Admitting: Emergency Medicine

## 2023-02-16 ENCOUNTER — Ambulatory Visit: Payer: Medicare Other

## 2023-02-16 DIAGNOSIS — J0100 Acute maxillary sinusitis, unspecified: Secondary | ICD-10-CM

## 2023-02-16 MED ORDER — AMOXICILLIN-POT CLAVULANATE 875-125 MG PO TABS: 1.0000 | ORAL_TABLET | Freq: Two times a day (BID) | ORAL | 0 refills | Status: AC

## 2023-02-16 NOTE — Discharge Instructions (Signed)
The Augmentin twice daily with food for 10 days for treatment of your sinusitis.  Perform sinus irrigation 2-3 times a day with a NeilMed sinus rinse kit and distilled water.  Do not use tap water.  You can use plain over-the-counter Mucinex every 6 hours to break up the stickiness of the mucus so your body can clear it.  Increase your oral fluid intake to thin out your mucus so that is also able for your body to clear more easily.  Take an over-the-counter probiotic, such as Culturelle-align-activia, 1 hour after each dose of antibiotic to prevent diarrhea.  If you develop any new or worsening symptoms return for reevaluation or see your primary care provider.  

## 2023-02-16 NOTE — ED Triage Notes (Signed)
Pt c/o nasal congestion & fatigue x3 days. Was seen on 6/3 for the same issue, was given Flonase w/o relief.

## 2023-02-16 NOTE — ED Provider Notes (Signed)
[facility]    CSN: [id number] Arrival date & time: 02/16/23  0802      History   Chief Complaint Chief Complaint  Patient presents with   Nasal Congestion   Fatigue    HPI Jake Hudson is a 82 y.o. male.   HPI  82 year old male with a past medical history of CKD D stage III, atrial fibrillation, prostate cancer presents for evaluation of 5 days worth of nasal congestion with yellow nasal discharge and some pink tinge to the mucus as well.  Sinus pressure in his cheeks and ear pain.  He denies fever or cough.  He was evaluated in this urgent care 3 days ago and was asked to do a trial of Flonase which she says helps for several hours but has not rectified his symptoms.  The patient has also been using nasal rinses which provide some relief but it is not lasting.  Past Medical History:  Diagnosis Date   Aortic atherosclerosis (HCC)    Arthritis    Asthma    Benign prostatic hyperplasia with elevated prostate specific antigen (PSA)    Bilateral lower extremity edema    CAD (coronary artery disease)    a.) 5v CABG 2014; b.) LHC 07/08/2020: 100% p-mLCx, 100% oLAD, 100% OM1, 100% p-mRCA, 70% o-pRCA, 70% D1; LIMA-LAD patent, SVG-PDA patent, SVG-OM 2 patent, SVG-D1 with 70% stenosis in the native vessel, SVG-OM1 occluded --> med mgmt.   CKD (chronic kidney disease), stage III (HCC)    Depression    Diastolic dysfunction    Erectile dysfunction    a.) on PDE5i (tadalafil)   Family history of adverse reaction to anesthesia    a.) delayed emergence in 1st degree relative (father)   GERD (gastroesophageal reflux disease)    History of bilateral cataract extraction 2023   History of kidney stones    Hyperlipidemia    Hypertension    Lumbar radiculopathy    Memory impairment    OSA (obstructive sleep apnea)    a.) unable to tolerate mask required for nocturnal PAP therapy   Osteoarthritis    PAF (paroxysmal atrial fibrillation) (HCC)    a.) CHA2DS2VASc =  5 (age x 2, HTN, vascular disease history, T2DM); b.) rate/rhythm maintained on oral metoprolol succinate; no chronic anticoagulation   PVC's (premature ventricular contractions)    Right inguinal hernia    S/P CABG x 5 2014   a.) done while living on [place]; LIMA-LAD, reverse SVG-diagonal, reverse SVG-OM1, reverse SVG-OM2, reverse SVG-PDA; procedure complicated by postoperative atrial fibrillation   T2DM (type 2 diabetes mellitus) (HCC)    Umbilical hernia     Patient Active Problem List   Diagnosis Date Noted   Cardiovascular stress test abnormal 04/05/2019   Electrocardiogram abnormal 04/05/2019   Pleural effusion 04/05/2019   Ventricular premature beats 04/05/2019   Elevated PSA 09/21/2018   Erectile dysfunction due to arterial insufficiency 09/21/2018   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 08/07/2018   Angina pectoris (HCC) 06/14/2018   Atrial fibrillation (HCC) 06/14/2018   Benign prostatic hyperplasia with lower urinary tract symptoms 06/14/2018   CKD (chronic kidney disease) stage 3, GFR 30-59 ml/min (HCC) 06/14/2018   Coronary arteriosclerosis 06/14/2018   DM (diabetes mellitus), type 2 (HCC) 06/14/2018   Fatigue 06/14/2018   Essential hypertension 06/14/2018   Hyperlipidemia 06/14/2018   Mitral valve regurgitation 06/14/2018   Obesity (BMI 30-39.9) 06/14/2018   Obstructive sleep apnea syndrome 06/14/2018   Ventricular premature complex 06/14/2018   Primary  osteoarthritis of right hip 04/04/2018   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 04/03/2018    Past Surgical History:  Procedure Laterality Date   BACK SURGERY     CARPAL TUNNEL RELEASE     CATARACT EXTRACTION W/PHACO Left 08/08/2022   Procedure: CATARACT EXTRACTION PHACO AND INTRAOCULAR LENS PLACEMENT (IOC) LEFT DIABETIC CLARION VIVITY TORIC LENS;  Surgeon: Nevada Crane, MD;  Location: [facility];  Service: Ophthalmology;  Laterality: Left;  7.19 0:45.8   CATARACT  EXTRACTION W/PHACO Right 08/22/2022   Procedure: CATARACT EXTRACTION PHACO AND INTRAOCULAR LENS PLACEMENT (IOC) RIGHT DIABETIC CLAREON VIVITY LENS  4.60  00:36.0;  Surgeon: Nevada Crane, MD;  Location: [facility];  Service: Ophthalmology;  Laterality: Right;  Diabetic   CHOLECYSTECTOMY     CORONARY ARTERY BYPASS GRAFT N/A 2014   Procedure: 5 VESSEL CORONARY ARTERY BYPASS GRAFT; Location: [place]   CYST REMOVAL LEG     CYSTOSCOPY W/ URETEROSCOPY W/ LITHOTRIPSY     2017   HERNIA REPAIR     KIDNEY STONE SURGERY     LEFT HEART CATH AND CORONARY ANGIOGRAPHY Left 07/08/2020   Procedure: LEFT HEART CATH AND CORONARY ANGIOGRAPHY;  Surgeon: Marcina Millard, MD;  Location: [facility];  Service: Cardiovascular;  Laterality: Left;   NASAL SEPTUM SURGERY     PROSTATE BIOPSY     PROSTATE BIOPSY N/A 12/20/2022   Procedure: PROSTATE BIOPSY;  Surgeon: [name], MD;  Location: [facility];  Service: Urology;  Laterality: N/A;   ROTATOR CUFF REPAIR     TONSILLECTOMY     TOTAL HIP ARTHROPLASTY Bilateral    TRANSRECTAL ULTRASOUND N/A 12/20/2022   Procedure: TRANSRECTAL ULTRASOUND;  Surgeon: [name], MD;  Location: [facility];  Service: Urology;  Laterality: N/A;       Home Medications    Prior to Admission medications   Medication Sig Start Date End Date Taking? Authorizing Provider  acetaminophen (TYLENOL) 500 MG tablet Take 650 mg by mouth 3 (three) times daily.   Yes [provider]  amoxicillin-clavulanate (AUGMENTIN) 875-125 MG tablet Take 1 tablet by mouth every 12 (twelve) hours for 10 days. 02/16/23 02/26/23 Yes Becky Augusta, NP  busPIRone (BUSPAR) 15 MG tablet Take 15 mg by mouth 2 (two) times daily.   Yes [provider]  cholecalciferol (VITAMIN D3) 25 MCG (1000 UNIT) tablet Take 1,000 Units by mouth daily.   Yes [provider]  Dulaglutide (TRULICITY) 3 MG/0.5ML SOPN Inject 3 mg into the skin every 7 (seven) days.   Yes  [provider]  finasteride (PROSCAR) 5 MG tablet TAKE (1) TABLET BY MOUTH EVERY DAY Patient taking differently: Take 5 mg by mouth at bedtime. 09/21/22  Yes Stoioff, Verna Czech, MD  fluticasone (FLONASE) 50 MCG/ACT nasal spray Place 1 spray into both nostrils daily. 02/13/23  Yes Radford Pax, NP  gabapentin (NEURONTIN) 100 MG capsule Take 500 mg by mouth at bedtime. 09/03/18  Yes [provider]  glimepiride (AMARYL) 4 MG tablet Take 4 mg by mouth 2 (two) times daily.  06/09/18  Yes [provider]  glucose blood test strip USE 3 TIMES DAILY 01/25/19  Yes [provider]  hydrochlorothiazide (HYDRODIURIL) 25 MG tablet Take by mouth. 07/24/20  Yes [provider]  LYSINE PO Take 3 capsules by mouth daily as needed (immune support).   Yes [provider]  metFORMIN (GLUCOPHAGE) 500 MG tablet Take 500 mg by mouth 2 (two) times daily.  Yes [provider]  metoprolol succinate (TOPROL-XL) 25 MG 24 hr tablet Take 12.5 mg by mouth daily. 03/14/19  Yes [provider]  Misc Natural Products (PROSTATE HEALTH PO) Take 1 tablet by mouth in the morning and at bedtime.    Yes [provider]  MOUNJARO 15 MG/0.5ML Pen SMARTSIG:15 Milligram(s) SUB-Q Once a Week   Yes [provider]  pantoprazole (PROTONIX) 40 MG tablet Take 40 mg by mouth daily.   Yes [provider]  potassium chloride (KLOR-CON) 10 MEQ tablet Take by mouth. 07/24/20 02/16/23 Yes [provider]  tadalafil (CIALIS) 20 MG tablet TAKE ONE TABLET BY MOUTH AS NEEDED 1 HOUR PRIOR TO INTERCOURSE 08/24/22  Yes Stoioff, Verna Czech, MD  tamsulosin (FLOMAX) 0.4 MG CAPS capsule Take 0.4 mg by mouth 2 (two) times daily.  09/03/18  Yes [provider]    Family History History reviewed. No pertinent family history.  Social History Social History   Tobacco Use   Smoking status: Never   Smokeless tobacco: Never  Vaping Use   Vaping Use: Never  used  Substance Use Topics   Alcohol use: Not Currently   Drug use: Never     Allergies   Rosuvastatin and Sulfa antibiotics   Review of Systems Review of Systems  Constitutional:  Negative for fever.  HENT:  Positive for congestion, ear pain, rhinorrhea and sinus pressure.   Respiratory:  Negative for cough.      Physical Exam Triage Vital Signs ED Triage Vitals  Enc Vitals Group     BP --      Pulse --      Resp 02/16/23 0815 16     Temp --      Temp Source 02/16/23 0815 Oral     SpO2 --      Weight 02/16/23 0814 195 lb (88.5 kg)     Height 02/16/23 0814 5\' 8"  (1.727 m)     Head Circumference --      Peak Flow --      Pain Score --      Pain Loc --      Pain Edu? --      Excl. in GC? --    No data found.  Updated Vital Signs BP (!) 157/84 (BP Location: Right Arm)   Pulse 93   Temp 98.7 F (37.1 C) (Oral)   Resp 16   Ht 5\' 8"  (1.727 m)   Wt 195 lb (88.5 kg)   SpO2 96%   BMI 29.65 kg/m   Visual Acuity Right Eye Distance:   Left Eye Distance:   Bilateral Distance:    Right Eye Near:   Left Eye Near:    Bilateral Near:     Physical Exam Vitals and nursing note reviewed.  Constitutional:      Appearance: Normal appearance. He is not ill-appearing.  HENT:     Head: Normocephalic and atraumatic.     Right Ear: Tympanic membrane, ear canal and external ear normal. There is no impacted cerumen.     Left Ear: Tympanic membrane, ear canal and external ear normal. There is no impacted cerumen.     Nose: Congestion and rhinorrhea present.     Comments: Nasal mucosa is erythematous and edematous with a mixture of thick yellow and bloody discharge in both nares.  Patient does have tenderness to compression of maxillary sinuses bilaterally.    Mouth/Throat:     Mouth: Mucous membranes are moist.  Pharynx: Oropharynx is clear. No oropharyngeal exudate or posterior oropharyngeal erythema.  Musculoskeletal:     Cervical back: Normal range of motion and  neck supple.  Lymphadenopathy:     Cervical: No cervical adenopathy.  Skin:    General: Skin is warm and dry.     Capillary Refill: Capillary refill takes less than 2 seconds.  Neurological:     General: No focal deficit present.     Mental Status: He is alert and oriented to person, place, and time.  Psychiatric:        Mood and Affect: Mood normal.        Behavior: Behavior normal.        Thought Content: Thought content normal.        Judgment: Judgment normal.      UC Treatments / Results  Labs (all labs ordered are listed, but only abnormal results are displayed) Labs Reviewed - No data to display  EKG   Radiology No results found.  Procedures Procedures (including critical care time)  Medications Ordered in UC Medications - No data to display  Initial Impression / Assessment and Plan / UC Course  I have reviewed the triage vital signs and the nursing notes.  Pertinent labs & imaging results that were available during my care of the patient were reviewed by me and considered in my medical decision making (see chart for details).   Patient is a nontoxic-appearing 82 year old male presenting for reevaluation of ongoing URI symptoms as outlined HPI above.  He was seen 3 days ago and diagnosed with viral URI and asked to do a trial of Flonase which has provided minimal relief.  He is also doing sinus irrigation without significant relief.  He denies fevers but states that his mucus is started become more thick and colored and he has seen some pinkish tinge in with his nasal discharge.  On exam he does have inflamed nasal mucosa with bloody yellow discharge in both nares and tenderness to bilateral maxillary sinuses to compression.  Given his continued symptoms I will treat him for maxillary sinusitis with Augmentin 875 mg twice daily for 10 days.  He does have CKD stage III but based on his calculated GFR from a CMP from March 2024 his creatinine clearance is 49 mL/min which  does not require any dosing adjustments.  I have encouraged him to continue his Flonase and sinus irrigation.  Also suggest that he had Mucinex to his repertoire to help break up the stickiness of his mucus so is easier for him to clear.  Return precautions reviewed.   Final Clinical Impressions(s) / UC Diagnoses   Final diagnoses:  Acute non-recurrent maxillary sinusitis     Discharge Instructions      The Augmentin twice daily with food for 10 days for treatment of your sinusitis.  Perform sinus irrigation 2-3 times a day with a NeilMed sinus rinse kit and distilled water.  Do not use tap water.  You can use plain over-the-counter Mucinex every 6 hours to break up the stickiness of the mucus so your body can clear it.  Increase your oral fluid intake to thin out your mucus so that is also able for your body to clear more easily.  Take an over-the-counter probiotic, such as Culturelle-align-activia, 1 hour after each dose of antibiotic to prevent diarrhea.  If you develop any new or worsening symptoms return for reevaluation or see your primary care provider.      ED Prescriptions  Medication Sig Dispense Auth. Provider   amoxicillin-clavulanate (AUGMENTIN) 875-125 MG tablet Take 1 tablet by mouth every 12 (twelve) hours for 10 days. 20 tablet Becky Augusta, NP      PDMP not reviewed this encounter.   Becky Augusta, NP 02/16/23 [phone]

## 2023-02-17 ENCOUNTER — Encounter: Payer: Medicare Other | Admitting: Urology

## 2023-02-20 ENCOUNTER — Other Ambulatory Visit: Payer: Self-pay

## 2023-02-20 ENCOUNTER — Ambulatory Visit
Admission: RE | Admit: 2023-02-20 | Discharge: 2023-02-20 | Disposition: A | Discharge status: Home / Self Care | Payer: Medicare Other | Source: Ambulatory Visit | Attending: Radiation Oncology | Admitting: Radiation Oncology

## 2023-02-20 DIAGNOSIS — C61 Malignant neoplasm of prostate: Secondary | ICD-10-CM | POA: Diagnosis present

## 2023-02-20 LAB — RAD ONC ARIA SESSION SUMMARY
Course Elapsed Days: 0
Plan Fractions Treated to Date: 1
Plan Prescribed Dose Per Fraction: 2 Gy
Plan Total Fractions Prescribed: 40
Plan Total Prescribed Dose: 80 Gy
Reference Point Dosage Given to Date: 2 Gy
Reference Point Session Dosage Given: 2 Gy
Session Number: 1

## 2023-02-21 ENCOUNTER — Other Ambulatory Visit: Payer: Self-pay

## 2023-02-21 ENCOUNTER — Ambulatory Visit
Admission: RE | Admit: 2023-02-21 | Discharge: 2023-02-21 | Disposition: A | Discharge status: Home / Self Care | Payer: Medicare Other | Source: Ambulatory Visit | Attending: Radiation Oncology | Admitting: Radiation Oncology

## 2023-02-21 DIAGNOSIS — C61 Malignant neoplasm of prostate: Secondary | ICD-10-CM | POA: Diagnosis not present

## 2023-02-21 LAB — RAD ONC ARIA SESSION SUMMARY
Course Elapsed Days: 1
Plan Fractions Treated to Date: 2
Plan Prescribed Dose Per Fraction: 2 Gy
Plan Total Fractions Prescribed: 40
Plan Total Prescribed Dose: 80 Gy
Reference Point Dosage Given to Date: 4 Gy
Reference Point Session Dosage Given: 2 Gy
Session Number: 2

## 2023-02-22 ENCOUNTER — Other Ambulatory Visit: Payer: Self-pay

## 2023-02-22 ENCOUNTER — Ambulatory Visit
Admission: RE | Admit: 2023-02-22 | Discharge: 2023-02-22 | Disposition: A | Discharge status: Home / Self Care | Payer: Medicare Other | Source: Ambulatory Visit | Attending: Radiation Oncology | Admitting: Radiation Oncology

## 2023-02-22 ENCOUNTER — Inpatient Hospital Stay: Payer: Medicare Other | Attending: Radiation Oncology

## 2023-02-22 DIAGNOSIS — C61 Malignant neoplasm of prostate: Secondary | ICD-10-CM | POA: Insufficient documentation

## 2023-02-22 LAB — RAD ONC ARIA SESSION SUMMARY
Course Elapsed Days: 2
Plan Fractions Treated to Date: 3
Plan Prescribed Dose Per Fraction: 2 Gy
Plan Total Fractions Prescribed: 40
Plan Total Prescribed Dose: 80 Gy
Reference Point Dosage Given to Date: 6 Gy
Reference Point Session Dosage Given: 2 Gy
Session Number: 3

## 2023-02-22 LAB — CBC (CANCER CENTER ONLY)
HCT: 40.6 % (ref 39.0–52.0)
Hemoglobin: 13.4 g/dL (ref 13.0–17.0)
MCH: 27.9 pg (ref 26.0–34.0)
MCHC: 33 g/dL (ref 30.0–36.0)
MCV: 84.6 fL (ref 80.0–100.0)
Platelet Count: 282 10*3/uL (ref 150–400)
RBC: 4.8 MIL/uL (ref 4.22–5.81)
RDW: 12.7 % (ref 11.5–15.5)
WBC Count: 6.8 10*3/uL (ref 4.0–10.5)
nRBC: 0 % (ref 0.0–0.2)

## 2023-02-23 ENCOUNTER — Ambulatory Visit
Admission: RE | Admit: 2023-02-23 | Discharge: 2023-02-23 | Disposition: A | Discharge status: Home / Self Care | Payer: Medicare Other | Source: Ambulatory Visit | Attending: Radiation Oncology | Admitting: Radiation Oncology

## 2023-02-23 ENCOUNTER — Other Ambulatory Visit: Payer: Self-pay

## 2023-02-23 DIAGNOSIS — C61 Malignant neoplasm of prostate: Secondary | ICD-10-CM | POA: Diagnosis not present

## 2023-02-23 LAB — RAD ONC ARIA SESSION SUMMARY
Course Elapsed Days: 3
Plan Fractions Treated to Date: 4
Plan Prescribed Dose Per Fraction: 2 Gy
Plan Total Fractions Prescribed: 40
Plan Total Prescribed Dose: 80 Gy
Reference Point Dosage Given to Date: 8 Gy
Reference Point Session Dosage Given: 2 Gy
Session Number: 4

## 2023-02-24 ENCOUNTER — Ambulatory Visit
Admission: RE | Admit: 2023-02-24 | Discharge: 2023-02-24 | Disposition: A | Discharge status: Home / Self Care | Payer: Medicare Other | Source: Ambulatory Visit | Attending: Radiation Oncology | Admitting: Radiation Oncology

## 2023-02-24 ENCOUNTER — Other Ambulatory Visit: Payer: Self-pay

## 2023-02-24 DIAGNOSIS — C61 Malignant neoplasm of prostate: Secondary | ICD-10-CM | POA: Diagnosis not present

## 2023-02-24 LAB — RAD ONC ARIA SESSION SUMMARY
Course Elapsed Days: 4
Plan Fractions Treated to Date: 5
Plan Prescribed Dose Per Fraction: 2 Gy
Plan Total Fractions Prescribed: 40
Plan Total Prescribed Dose: 80 Gy
Reference Point Dosage Given to Date: 10 Gy
Reference Point Session Dosage Given: 2 Gy
Session Number: 5

## 2023-02-27 ENCOUNTER — Ambulatory Visit
Admission: RE | Admit: 2023-02-27 | Discharge: 2023-02-27 | Disposition: A | Discharge status: Home / Self Care | Payer: Medicare Other | Source: Ambulatory Visit | Attending: Radiation Oncology | Admitting: Radiation Oncology

## 2023-02-27 ENCOUNTER — Other Ambulatory Visit: Payer: Self-pay

## 2023-02-27 DIAGNOSIS — C61 Malignant neoplasm of prostate: Secondary | ICD-10-CM | POA: Diagnosis not present

## 2023-02-27 LAB — RAD ONC ARIA SESSION SUMMARY
Course Elapsed Days: 7
Plan Fractions Treated to Date: 6
Plan Prescribed Dose Per Fraction: 2 Gy
Plan Total Fractions Prescribed: 40
Plan Total Prescribed Dose: 80 Gy
Reference Point Dosage Given to Date: 12 Gy
Reference Point Session Dosage Given: 2 Gy
Session Number: 6

## 2023-02-28 ENCOUNTER — Other Ambulatory Visit: Payer: Self-pay

## 2023-02-28 ENCOUNTER — Ambulatory Visit
Admission: RE | Admit: 2023-02-28 | Discharge: 2023-02-28 | Disposition: A | Discharge status: Home / Self Care | Payer: Medicare Other | Source: Ambulatory Visit | Attending: Radiation Oncology | Admitting: Radiation Oncology

## 2023-02-28 DIAGNOSIS — C61 Malignant neoplasm of prostate: Secondary | ICD-10-CM | POA: Diagnosis not present

## 2023-02-28 LAB — RAD ONC ARIA SESSION SUMMARY
Course Elapsed Days: 8
Plan Fractions Treated to Date: 7
Plan Prescribed Dose Per Fraction: 2 Gy
Plan Total Fractions Prescribed: 40
Plan Total Prescribed Dose: 80 Gy
Reference Point Dosage Given to Date: 14 Gy
Reference Point Session Dosage Given: 2 Gy
Session Number: 7

## 2023-03-01 ENCOUNTER — Other Ambulatory Visit: Payer: Self-pay

## 2023-03-01 ENCOUNTER — Ambulatory Visit
Admission: RE | Admit: 2023-03-01 | Discharge: 2023-03-01 | Disposition: A | Discharge status: Home / Self Care | Payer: Medicare Other | Source: Ambulatory Visit | Attending: Radiation Oncology | Admitting: Radiation Oncology

## 2023-03-01 DIAGNOSIS — C61 Malignant neoplasm of prostate: Secondary | ICD-10-CM | POA: Diagnosis not present

## 2023-03-01 LAB — RAD ONC ARIA SESSION SUMMARY
Course Elapsed Days: 9
Plan Fractions Treated to Date: 8
Plan Prescribed Dose Per Fraction: 2 Gy
Plan Total Fractions Prescribed: 40
Plan Total Prescribed Dose: 80 Gy
Reference Point Dosage Given to Date: 16 Gy
Reference Point Session Dosage Given: 2 Gy
Session Number: 8

## 2023-03-02 ENCOUNTER — Ambulatory Visit
Admission: RE | Admit: 2023-03-02 | Discharge: 2023-03-02 | Disposition: A | Discharge status: Home / Self Care | Payer: Medicare Other | Source: Ambulatory Visit | Attending: Radiation Oncology | Admitting: Radiation Oncology

## 2023-03-02 ENCOUNTER — Other Ambulatory Visit: Payer: Self-pay

## 2023-03-02 DIAGNOSIS — C61 Malignant neoplasm of prostate: Secondary | ICD-10-CM | POA: Diagnosis not present

## 2023-03-02 LAB — RAD ONC ARIA SESSION SUMMARY
Course Elapsed Days: 10
Plan Fractions Treated to Date: 9
Plan Prescribed Dose Per Fraction: 2 Gy
Plan Total Fractions Prescribed: 40
Plan Total Prescribed Dose: 80 Gy
Reference Point Dosage Given to Date: 18 Gy
Reference Point Session Dosage Given: 2 Gy
Session Number: 9

## 2023-03-03 ENCOUNTER — Other Ambulatory Visit: Payer: Self-pay

## 2023-03-03 ENCOUNTER — Ambulatory Visit
Admission: RE | Admit: 2023-03-03 | Discharge: 2023-03-03 | Disposition: A | Discharge status: Home / Self Care | Payer: Medicare Other | Source: Ambulatory Visit | Attending: Radiation Oncology | Admitting: Radiation Oncology

## 2023-03-03 DIAGNOSIS — C61 Malignant neoplasm of prostate: Secondary | ICD-10-CM | POA: Diagnosis not present

## 2023-03-03 LAB — RAD ONC ARIA SESSION SUMMARY
Course Elapsed Days: 11
Plan Fractions Treated to Date: 10
Plan Prescribed Dose Per Fraction: 2 Gy
Plan Total Fractions Prescribed: 40
Plan Total Prescribed Dose: 80 Gy
Reference Point Dosage Given to Date: 20 Gy
Reference Point Session Dosage Given: 2 Gy
Session Number: 10

## 2023-03-06 ENCOUNTER — Other Ambulatory Visit: Payer: Self-pay

## 2023-03-06 ENCOUNTER — Ambulatory Visit
Admission: RE | Admit: 2023-03-06 | Discharge: 2023-03-06 | Disposition: A | Discharge status: Home / Self Care | Payer: Medicare Other | Source: Ambulatory Visit | Attending: Radiation Oncology | Admitting: Radiation Oncology

## 2023-03-06 DIAGNOSIS — C61 Malignant neoplasm of prostate: Secondary | ICD-10-CM | POA: Diagnosis not present

## 2023-03-06 LAB — RAD ONC ARIA SESSION SUMMARY
Course Elapsed Days: 14
Plan Fractions Treated to Date: 11
Plan Prescribed Dose Per Fraction: 2 Gy
Plan Total Fractions Prescribed: 40
Plan Total Prescribed Dose: 80 Gy
Reference Point Dosage Given to Date: 22 Gy
Reference Point Session Dosage Given: 2 Gy
Session Number: 11

## 2023-03-07 ENCOUNTER — Ambulatory Visit
Admission: RE | Admit: 2023-03-07 | Discharge: 2023-03-07 | Disposition: A | Discharge status: Home / Self Care | Payer: Medicare Other | Source: Ambulatory Visit | Attending: Radiation Oncology | Admitting: Radiation Oncology

## 2023-03-07 ENCOUNTER — Other Ambulatory Visit: Payer: Self-pay

## 2023-03-07 ENCOUNTER — Other Ambulatory Visit: Payer: Self-pay | Admitting: *Deleted

## 2023-03-07 DIAGNOSIS — C61 Malignant neoplasm of prostate: Secondary | ICD-10-CM | POA: Diagnosis not present

## 2023-03-07 LAB — RAD ONC ARIA SESSION SUMMARY
Course Elapsed Days: 15
Plan Fractions Treated to Date: 12
Plan Prescribed Dose Per Fraction: 2 Gy
Plan Total Fractions Prescribed: 40
Plan Total Prescribed Dose: 80 Gy
Reference Point Dosage Given to Date: 24 Gy
Reference Point Session Dosage Given: 2 Gy
Session Number: 12

## 2023-03-07 MED ORDER — HYDROCORTISONE (PERIANAL) 2.5 % EX CREA: 1.0000 | TOPICAL_CREAM | Freq: Two times a day (BID) | CUTANEOUS | 0 refills | Status: AC

## 2023-03-08 ENCOUNTER — Inpatient Hospital Stay: Payer: Medicare Other

## 2023-03-08 ENCOUNTER — Ambulatory Visit
Admission: RE | Admit: 2023-03-08 | Discharge: 2023-03-08 | Disposition: A | Discharge status: Home / Self Care | Payer: Medicare Other | Source: Ambulatory Visit | Attending: Radiation Oncology | Admitting: Radiation Oncology

## 2023-03-08 ENCOUNTER — Other Ambulatory Visit: Payer: Self-pay

## 2023-03-08 DIAGNOSIS — C61 Malignant neoplasm of prostate: Secondary | ICD-10-CM | POA: Diagnosis not present

## 2023-03-08 LAB — RAD ONC ARIA SESSION SUMMARY
Course Elapsed Days: 16
Plan Fractions Treated to Date: 13
Plan Prescribed Dose Per Fraction: 2 Gy
Plan Total Fractions Prescribed: 40
Plan Total Prescribed Dose: 80 Gy
Reference Point Dosage Given to Date: 26 Gy
Reference Point Session Dosage Given: 2 Gy
Session Number: 13

## 2023-03-08 LAB — CBC (CANCER CENTER ONLY)
HCT: 41.7 % (ref 39.0–52.0)
Hemoglobin: 13.9 g/dL (ref 13.0–17.0)
MCH: 27.9 pg (ref 26.0–34.0)
MCHC: 33.3 g/dL (ref 30.0–36.0)
MCV: 83.6 fL (ref 80.0–100.0)
Platelet Count: 252 10*3/uL (ref 150–400)
RBC: 4.99 MIL/uL (ref 4.22–5.81)
RDW: 12.8 % (ref 11.5–15.5)
WBC Count: 7.4 10*3/uL (ref 4.0–10.5)
nRBC: 0 % (ref 0.0–0.2)

## 2023-03-09 ENCOUNTER — Ambulatory Visit
Admission: RE | Admit: 2023-03-09 | Discharge: 2023-03-09 | Disposition: A | Discharge status: Home / Self Care | Payer: Medicare Other | Source: Ambulatory Visit | Attending: Radiation Oncology | Admitting: Radiation Oncology

## 2023-03-09 ENCOUNTER — Other Ambulatory Visit: Payer: Self-pay

## 2023-03-09 DIAGNOSIS — C61 Malignant neoplasm of prostate: Secondary | ICD-10-CM | POA: Diagnosis not present

## 2023-03-09 LAB — RAD ONC ARIA SESSION SUMMARY
Course Elapsed Days: 17
Plan Fractions Treated to Date: 14
Plan Prescribed Dose Per Fraction: 2 Gy
Plan Total Fractions Prescribed: 40
Plan Total Prescribed Dose: 80 Gy
Reference Point Dosage Given to Date: 28 Gy
Reference Point Session Dosage Given: 2 Gy
Session Number: 14

## 2023-03-10 ENCOUNTER — Other Ambulatory Visit: Payer: Self-pay

## 2023-03-10 ENCOUNTER — Ambulatory Visit
Admission: RE | Admit: 2023-03-10 | Discharge: 2023-03-10 | Disposition: A | Discharge status: Home / Self Care | Payer: Medicare Other | Source: Ambulatory Visit | Attending: Radiation Oncology | Admitting: Radiation Oncology

## 2023-03-10 DIAGNOSIS — C61 Malignant neoplasm of prostate: Secondary | ICD-10-CM | POA: Diagnosis not present

## 2023-03-10 LAB — RAD ONC ARIA SESSION SUMMARY
Course Elapsed Days: 18
Plan Fractions Treated to Date: 15
Plan Prescribed Dose Per Fraction: 2 Gy
Plan Total Fractions Prescribed: 40
Plan Total Prescribed Dose: 80 Gy
Reference Point Dosage Given to Date: 30 Gy
Reference Point Session Dosage Given: 2 Gy
Session Number: 15

## 2023-03-13 ENCOUNTER — Other Ambulatory Visit: Payer: Self-pay

## 2023-03-13 ENCOUNTER — Ambulatory Visit
Admission: RE | Admit: 2023-03-13 | Discharge: 2023-03-13 | Disposition: A | Discharge status: Home / Self Care | Payer: Medicare Other | Source: Ambulatory Visit | Attending: Radiation Oncology | Admitting: Radiation Oncology

## 2023-03-13 DIAGNOSIS — C61 Malignant neoplasm of prostate: Secondary | ICD-10-CM | POA: Diagnosis present

## 2023-03-13 LAB — RAD ONC ARIA SESSION SUMMARY
Course Elapsed Days: 21
Plan Fractions Treated to Date: 16
Plan Prescribed Dose Per Fraction: 2 Gy
Plan Total Fractions Prescribed: 40
Plan Total Prescribed Dose: 80 Gy
Reference Point Dosage Given to Date: 32 Gy
Reference Point Session Dosage Given: 2 Gy
Session Number: 16

## 2023-03-14 ENCOUNTER — Other Ambulatory Visit: Payer: Self-pay

## 2023-03-14 ENCOUNTER — Ambulatory Visit
Admission: RE | Admit: 2023-03-14 | Discharge: 2023-03-14 | Disposition: A | Discharge status: Home / Self Care | Payer: Medicare Other | Source: Ambulatory Visit | Attending: Radiation Oncology | Admitting: Radiation Oncology

## 2023-03-14 DIAGNOSIS — C61 Malignant neoplasm of prostate: Secondary | ICD-10-CM | POA: Diagnosis not present

## 2023-03-14 LAB — RAD ONC ARIA SESSION SUMMARY
Course Elapsed Days: 22
Plan Fractions Treated to Date: 17
Plan Prescribed Dose Per Fraction: 2 Gy
Plan Total Fractions Prescribed: 40
Plan Total Prescribed Dose: 80 Gy
Reference Point Dosage Given to Date: 34 Gy
Reference Point Session Dosage Given: 2 Gy
Session Number: 17

## 2023-03-15 ENCOUNTER — Ambulatory Visit: Payer: Medicare Other

## 2023-03-15 ENCOUNTER — Inpatient Hospital Stay: Payer: Medicare Other

## 2023-03-15 ENCOUNTER — Ambulatory Visit
Admission: RE | Admit: 2023-03-15 | Discharge: 2023-03-15 | Disposition: A | Discharge status: Home / Self Care | Payer: Medicare Other | Source: Ambulatory Visit | Attending: Radiation Oncology | Admitting: Radiation Oncology

## 2023-03-15 ENCOUNTER — Other Ambulatory Visit: Payer: Self-pay

## 2023-03-15 DIAGNOSIS — C61 Malignant neoplasm of prostate: Secondary | ICD-10-CM | POA: Diagnosis not present

## 2023-03-15 LAB — RAD ONC ARIA SESSION SUMMARY
Course Elapsed Days: 23
Plan Fractions Treated to Date: 18
Plan Prescribed Dose Per Fraction: 2 Gy
Plan Total Fractions Prescribed: 40
Plan Total Prescribed Dose: 80 Gy
Reference Point Dosage Given to Date: 36 Gy
Reference Point Session Dosage Given: 2 Gy
Session Number: 18

## 2023-03-17 ENCOUNTER — Other Ambulatory Visit: Payer: Self-pay

## 2023-03-17 ENCOUNTER — Ambulatory Visit
Admission: RE | Admit: 2023-03-17 | Discharge: 2023-03-17 | Disposition: A | Discharge status: Home / Self Care | Payer: Medicare Other | Source: Ambulatory Visit | Attending: Radiation Oncology | Admitting: Radiation Oncology

## 2023-03-17 DIAGNOSIS — C61 Malignant neoplasm of prostate: Secondary | ICD-10-CM | POA: Diagnosis not present

## 2023-03-17 LAB — RAD ONC ARIA SESSION SUMMARY
Course Elapsed Days: 25
Plan Fractions Treated to Date: 19
Plan Prescribed Dose Per Fraction: 2 Gy
Plan Total Fractions Prescribed: 40
Plan Total Prescribed Dose: 80 Gy
Reference Point Dosage Given to Date: 38 Gy
Reference Point Session Dosage Given: 2 Gy
Session Number: 19

## 2023-03-20 ENCOUNTER — Other Ambulatory Visit: Payer: Self-pay

## 2023-03-20 ENCOUNTER — Ambulatory Visit
Admission: RE | Admit: 2023-03-20 | Discharge: 2023-03-20 | Disposition: A | Discharge status: Home / Self Care | Payer: Medicare Other | Source: Ambulatory Visit | Attending: Radiation Oncology | Admitting: Radiation Oncology

## 2023-03-20 DIAGNOSIS — C61 Malignant neoplasm of prostate: Secondary | ICD-10-CM | POA: Diagnosis not present

## 2023-03-20 LAB — RAD ONC ARIA SESSION SUMMARY
Course Elapsed Days: 28
Plan Fractions Treated to Date: 20
Plan Prescribed Dose Per Fraction: 2 Gy
Plan Total Fractions Prescribed: 40
Plan Total Prescribed Dose: 80 Gy
Reference Point Dosage Given to Date: 40 Gy
Reference Point Session Dosage Given: 2 Gy
Session Number: 20

## 2023-03-21 ENCOUNTER — Other Ambulatory Visit: Payer: Self-pay

## 2023-03-21 ENCOUNTER — Ambulatory Visit
Admission: RE | Admit: 2023-03-21 | Discharge: 2023-03-21 | Disposition: A | Discharge status: Home / Self Care | Payer: Medicare Other | Source: Ambulatory Visit | Attending: Radiation Oncology | Admitting: Radiation Oncology

## 2023-03-21 DIAGNOSIS — C61 Malignant neoplasm of prostate: Secondary | ICD-10-CM | POA: Diagnosis not present

## 2023-03-21 LAB — RAD ONC ARIA SESSION SUMMARY
Course Elapsed Days: 29
Plan Fractions Treated to Date: 21
Plan Prescribed Dose Per Fraction: 2 Gy
Plan Total Fractions Prescribed: 40
Plan Total Prescribed Dose: 80 Gy
Reference Point Dosage Given to Date: 42 Gy
Reference Point Session Dosage Given: 2 Gy
Session Number: 21

## 2023-03-22 ENCOUNTER — Inpatient Hospital Stay: Payer: Medicare Other

## 2023-03-22 ENCOUNTER — Other Ambulatory Visit: Payer: Self-pay

## 2023-03-22 ENCOUNTER — Ambulatory Visit
Admission: RE | Admit: 2023-03-22 | Discharge: 2023-03-22 | Disposition: A | Discharge status: Home / Self Care | Payer: Medicare Other | Source: Ambulatory Visit | Attending: Radiation Oncology | Admitting: Radiation Oncology

## 2023-03-22 DIAGNOSIS — C61 Malignant neoplasm of prostate: Secondary | ICD-10-CM | POA: Insufficient documentation

## 2023-03-22 LAB — RAD ONC ARIA SESSION SUMMARY
Course Elapsed Days: 30
Plan Fractions Treated to Date: 22
Plan Prescribed Dose Per Fraction: 2 Gy
Plan Total Fractions Prescribed: 40
Plan Total Prescribed Dose: 80 Gy
Reference Point Dosage Given to Date: 44 Gy
Reference Point Session Dosage Given: 2 Gy
Session Number: 22

## 2023-03-23 ENCOUNTER — Ambulatory Visit
Admission: RE | Admit: 2023-03-23 | Discharge: 2023-03-23 | Disposition: A | Discharge status: Home / Self Care | Payer: Medicare Other | Source: Ambulatory Visit | Attending: Radiation Oncology | Admitting: Radiation Oncology

## 2023-03-23 ENCOUNTER — Other Ambulatory Visit: Payer: Self-pay

## 2023-03-23 DIAGNOSIS — C61 Malignant neoplasm of prostate: Secondary | ICD-10-CM | POA: Diagnosis not present

## 2023-03-23 LAB — RAD ONC ARIA SESSION SUMMARY
Course Elapsed Days: 31
Plan Fractions Treated to Date: 23
Plan Prescribed Dose Per Fraction: 2 Gy
Plan Total Fractions Prescribed: 40
Plan Total Prescribed Dose: 80 Gy
Reference Point Dosage Given to Date: 46 Gy
Reference Point Session Dosage Given: 2 Gy
Session Number: 23

## 2023-03-24 ENCOUNTER — Ambulatory Visit
Admission: RE | Admit: 2023-03-24 | Discharge: 2023-03-24 | Disposition: A | Discharge status: Home / Self Care | Payer: Medicare Other | Source: Ambulatory Visit | Attending: Radiation Oncology | Admitting: Radiation Oncology

## 2023-03-24 ENCOUNTER — Other Ambulatory Visit: Payer: Self-pay

## 2023-03-24 DIAGNOSIS — C61 Malignant neoplasm of prostate: Secondary | ICD-10-CM | POA: Diagnosis not present

## 2023-03-24 LAB — RAD ONC ARIA SESSION SUMMARY
Course Elapsed Days: 32
Plan Fractions Treated to Date: 24
Plan Prescribed Dose Per Fraction: 2 Gy
Plan Total Fractions Prescribed: 40
Plan Total Prescribed Dose: 80 Gy
Reference Point Dosage Given to Date: 48 Gy
Reference Point Session Dosage Given: 2 Gy
Session Number: 24

## 2023-03-27 ENCOUNTER — Other Ambulatory Visit: Payer: Self-pay

## 2023-03-27 ENCOUNTER — Ambulatory Visit: Admission: RE | Admit: 2023-03-27 | Payer: Medicare Other | Source: Ambulatory Visit

## 2023-03-27 DIAGNOSIS — C61 Malignant neoplasm of prostate: Secondary | ICD-10-CM | POA: Diagnosis not present

## 2023-03-27 LAB — RAD ONC ARIA SESSION SUMMARY
Course Elapsed Days: 35
Plan Fractions Treated to Date: 25
Plan Prescribed Dose Per Fraction: 2 Gy
Plan Total Fractions Prescribed: 40
Plan Total Prescribed Dose: 80 Gy
Reference Point Dosage Given to Date: 50 Gy
Reference Point Session Dosage Given: 2 Gy
Session Number: 25

## 2023-03-28 ENCOUNTER — Ambulatory Visit
Admission: RE | Admit: 2023-03-28 | Discharge: 2023-03-28 | Disposition: A | Discharge status: Home / Self Care | Payer: Medicare Other | Source: Ambulatory Visit | Attending: Radiation Oncology | Admitting: Radiation Oncology

## 2023-03-28 ENCOUNTER — Other Ambulatory Visit: Payer: Self-pay

## 2023-03-28 DIAGNOSIS — C61 Malignant neoplasm of prostate: Secondary | ICD-10-CM | POA: Diagnosis not present

## 2023-03-28 LAB — RAD ONC ARIA SESSION SUMMARY
Course Elapsed Days: 36
Plan Fractions Treated to Date: 26
Plan Prescribed Dose Per Fraction: 2 Gy
Plan Total Fractions Prescribed: 40
Plan Total Prescribed Dose: 80 Gy
Reference Point Dosage Given to Date: 52 Gy
Reference Point Session Dosage Given: 2 Gy
Session Number: 26

## 2023-03-29 ENCOUNTER — Other Ambulatory Visit: Payer: Self-pay

## 2023-03-29 ENCOUNTER — Ambulatory Visit
Admission: RE | Admit: 2023-03-29 | Discharge: 2023-03-29 | Disposition: A | Discharge status: Home / Self Care | Payer: Medicare Other | Source: Ambulatory Visit | Attending: Radiation Oncology | Admitting: Radiation Oncology

## 2023-03-29 ENCOUNTER — Inpatient Hospital Stay: Payer: Medicare Other

## 2023-03-29 DIAGNOSIS — C61 Malignant neoplasm of prostate: Secondary | ICD-10-CM | POA: Diagnosis not present

## 2023-03-29 LAB — RAD ONC ARIA SESSION SUMMARY
Course Elapsed Days: 37
Plan Fractions Treated to Date: 27
Plan Prescribed Dose Per Fraction: 2 Gy
Plan Total Fractions Prescribed: 40
Plan Total Prescribed Dose: 80 Gy
Reference Point Dosage Given to Date: 54 Gy
Reference Point Session Dosage Given: 2 Gy
Session Number: 27

## 2023-03-30 ENCOUNTER — Ambulatory Visit
Admission: RE | Admit: 2023-03-30 | Discharge: 2023-03-30 | Disposition: A | Discharge status: Home / Self Care | Payer: Medicare Other | Source: Ambulatory Visit | Attending: Radiation Oncology | Admitting: Radiation Oncology

## 2023-03-30 ENCOUNTER — Other Ambulatory Visit: Payer: Self-pay

## 2023-03-30 DIAGNOSIS — C61 Malignant neoplasm of prostate: Secondary | ICD-10-CM | POA: Diagnosis not present

## 2023-03-30 LAB — RAD ONC ARIA SESSION SUMMARY
Course Elapsed Days: 38
Plan Fractions Treated to Date: 28
Plan Prescribed Dose Per Fraction: 2 Gy
Plan Total Fractions Prescribed: 40
Plan Total Prescribed Dose: 80 Gy
Reference Point Dosage Given to Date: 56 Gy
Reference Point Session Dosage Given: 2 Gy
Session Number: 28

## 2023-03-31 ENCOUNTER — Ambulatory Visit: Payer: Medicare Other

## 2023-03-31 ENCOUNTER — Ambulatory Visit
Admission: EM | Admit: 2023-03-31 | Discharge: 2023-03-31 | Disposition: A | Discharge status: Home / Self Care | Payer: Medicare Other

## 2023-03-31 ENCOUNTER — Encounter: Payer: Self-pay | Admitting: Emergency Medicine

## 2023-03-31 DIAGNOSIS — M7989 Other specified soft tissue disorders: Secondary | ICD-10-CM

## 2023-03-31 DIAGNOSIS — M79661 Pain in right lower leg: Secondary | ICD-10-CM | POA: Diagnosis not present

## 2023-03-31 NOTE — ED Provider Notes (Signed)
[facility]    CSN: [id number] Arrival date & time: 03/31/23  4010      History   Chief Complaint Chief Complaint  Patient presents with   Leg Pain    right    HPI Jake Hudson is a 82 y.o. male with history of prostate cancer-currently on radiation, CAD--previous CABG x 5, asthma, HTN, HLD, OSA, PAF, PVCs, CKD stage III, T2DM, and diastolic dysfunction.   Patient reports pain of right calf with mild swelling since yesterday. He says he was doing things around the kitchen when he noticed it. Denies injury. Patient says his left feels "tired." He says it was difficult to walk across the parking lot to the urgent care because it felt so tired. Denies redness, warmth. No fever. He says he was up and down off a ladder yesterday and does not know it that is related. He also has history of PAD and neuropathy. No history of DVT or PE. Not on anticoagulants. Taking Tylenol.  HPI  Past Medical History:  Diagnosis Date   Aortic atherosclerosis (HCC)    Arthritis    Asthma    Benign prostatic hyperplasia with elevated prostate specific antigen (PSA)    Bilateral lower extremity edema    CAD (coronary artery disease)    a.) 5v CABG 2014; b.) LHC 07/08/2020: 100% p-mLCx, 100% oLAD, 100% OM1, 100% p-mRCA, 70% o-pRCA, 70% D1; LIMA-LAD patent, SVG-PDA patent, SVG-OM 2 patent, SVG-D1 with 70% stenosis in the native vessel, SVG-OM1 occluded --> med mgmt.   CKD (chronic kidney disease), stage III (HCC)    Depression    Diastolic dysfunction    Erectile dysfunction    a.) on PDE5i (tadalafil)   Family history of adverse reaction to anesthesia    a.) delayed emergence in 1st degree relative (father)   GERD (gastroesophageal reflux disease)    History of bilateral cataract extraction 2023   History of kidney stones    Hyperlipidemia    Hypertension    Lumbar radiculopathy    Memory impairment    OSA (obstructive sleep apnea)    a.) unable to tolerate mask required  for nocturnal PAP therapy   Osteoarthritis    PAF (paroxysmal atrial fibrillation) (HCC)    a.) CHA2DS2VASc = 5 (age x 2, HTN, vascular disease history, T2DM); b.) rate/rhythm maintained on oral metoprolol succinate; no chronic anticoagulation   PVC's (premature ventricular contractions)    Right inguinal hernia    S/P CABG x 5 2014   a.) done while living on [place]; LIMA-LAD, reverse SVG-diagonal, reverse SVG-OM1, reverse SVG-OM2, reverse SVG-PDA; procedure complicated by postoperative atrial fibrillation   T2DM (type 2 diabetes mellitus) (HCC)    Umbilical hernia     Patient Active Problem List   Diagnosis Date Noted   Cardiovascular stress test abnormal 04/05/2019   Electrocardiogram abnormal 04/05/2019   Pleural effusion 04/05/2019   Ventricular premature beats 04/05/2019   Elevated PSA 09/21/2018   Erectile dysfunction due to arterial insufficiency 09/21/2018   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 08/07/2018   Angina pectoris (HCC) 06/14/2018   Atrial fibrillation (HCC) 06/14/2018   Benign prostatic hyperplasia with lower urinary tract symptoms 06/14/2018   CKD (chronic kidney disease) stage 3, GFR 30-59 ml/min (HCC) 06/14/2018   Coronary arteriosclerosis 06/14/2018   DM (diabetes mellitus), type 2 (HCC) 06/14/2018   Fatigue 06/14/2018   Essential hypertension 06/14/2018   Hyperlipidemia 06/14/2018   Mitral valve regurgitation 06/14/2018   Obesity (BMI 30-39.9) 06/14/2018  Obstructive sleep apnea syndrome 06/14/2018   Ventricular premature complex 06/14/2018   Primary osteoarthritis of right hip 04/04/2018   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 04/03/2018    Past Surgical History:  Procedure Laterality Date   BACK SURGERY     CARPAL TUNNEL RELEASE     CATARACT EXTRACTION W/PHACO Left 08/08/2022   Procedure: CATARACT EXTRACTION PHACO AND INTRAOCULAR LENS PLACEMENT (IOC) LEFT DIABETIC CLARION VIVITY TORIC LENS;  Surgeon: Nevada Crane, MD;  Location: [facility];  Service: Ophthalmology;  Laterality: Left;  7.19 0:45.8   CATARACT EXTRACTION W/PHACO Right 08/22/2022   Procedure: CATARACT EXTRACTION PHACO AND INTRAOCULAR LENS PLACEMENT (IOC) RIGHT DIABETIC CLAREON VIVITY LENS  4.60  00:36.0;  Surgeon: Nevada Crane, MD;  Location: [facility];  Service: Ophthalmology;  Laterality: Right;  Diabetic   CHOLECYSTECTOMY     CORONARY ARTERY BYPASS GRAFT N/A 2014   Procedure: 5 VESSEL CORONARY ARTERY BYPASS GRAFT; Location: [place]   CYST REMOVAL LEG     CYSTOSCOPY W/ URETEROSCOPY W/ LITHOTRIPSY     2017   HERNIA REPAIR     KIDNEY STONE SURGERY     LEFT HEART CATH AND CORONARY ANGIOGRAPHY Left 07/08/2020   Procedure: LEFT HEART CATH AND CORONARY ANGIOGRAPHY;  Surgeon: Marcina Millard, MD;  Location: [facility];  Service: Cardiovascular;  Laterality: Left;   NASAL SEPTUM SURGERY     PROSTATE BIOPSY     PROSTATE BIOPSY N/A 12/20/2022   Procedure: PROSTATE BIOPSY;  Surgeon: [name], MD;  Location: [facility];  Service: Urology;  Laterality: N/A;   ROTATOR CUFF REPAIR     TONSILLECTOMY     TOTAL HIP ARTHROPLASTY Bilateral    TRANSRECTAL ULTRASOUND N/A 12/20/2022   Procedure: TRANSRECTAL ULTRASOUND;  Surgeon: [name], MD;  Location: [facility];  Service: Urology;  Laterality: N/A;       Home Medications    Prior to Admission medications   Medication Sig Start Date End Date Taking? Authorizing Provider  busPIRone (BUSPAR) 15 MG tablet Take 15 mg by mouth 2 (two) times daily.   Yes [provider]  cholecalciferol (VITAMIN D3) 25 MCG (1000 UNIT) tablet Take 1,000 Units by mouth daily.   Yes [provider]  finasteride (PROSCAR) 5 MG tablet TAKE (1) TABLET BY MOUTH EVERY DAY Patient taking differently: Take 5 mg by mouth at bedtime. 09/21/22  Yes Stoioff, Verna Czech, MD  fluticasone (FLONASE) 50 MCG/ACT nasal spray Place 1 spray into both nostrils daily. 02/13/23   Yes Radford Pax, NP  gabapentin (NEURONTIN) 100 MG capsule Take 500 mg by mouth at bedtime. 09/03/18  Yes [provider]  glimepiride (AMARYL) 4 MG tablet Take 4 mg by mouth 2 (two) times daily.  06/09/18  Yes [provider]  hydrochlorothiazide (HYDRODIURIL) 25 MG tablet Take by mouth. 07/24/20  Yes [provider]  LYSINE PO Take 3 capsules by mouth daily as needed (immune support).   Yes [provider]  metFORMIN (GLUCOPHAGE) 500 MG tablet Take 500 mg by mouth 2 (two) times daily.   Yes [provider]  MOUNJARO 15 MG/0.5ML Pen SMARTSIG:15 Milligram(s) SUB-Q Once a Week   Yes [provider]  pantoprazole (PROTONIX) 40 MG tablet Take 40 mg by mouth daily.   Yes [provider]  tamsulosin (FLOMAX) 0.4 MG CAPS capsule Take 0.4 mg by mouth 2 (two) times daily.  09/03/18  Yes [provider]  acetaminophen (TYLENOL) 500  MG tablet Take 650 mg by mouth 3 (three) times daily.    [provider]  Dulaglutide (TRULICITY) 3 MG/0.5ML SOPN Inject 3 mg into the skin every 7 (seven) days.    [provider]  glucose blood test strip USE 3 TIMES DAILY 01/25/19   [provider]  hydrocortisone (ANUSOL-HC) 2.5 % rectal cream Place 1 Application rectally 2 (two) times daily. 03/07/23   [name], MD  metoprolol succinate (TOPROL-XL) 25 MG 24 hr tablet Take 12.5 mg by mouth daily. 03/14/19   [provider]  Misc Natural Products (PROSTATE HEALTH PO) Take 1 tablet by mouth in the morning and at bedtime.     [provider]  potassium chloride (KLOR-CON) 10 MEQ tablet Take by mouth. 07/24/20 02/16/23  [provider]  tadalafil (CIALIS) 20 MG tablet TAKE ONE TABLET BY MOUTH AS NEEDED 1 HOUR PRIOR TO INTERCOURSE 08/24/22   Stoioff, Verna Czech, MD    Family History No family history on file.  Social History Social History   Tobacco Use   Smoking status: Never   Smokeless tobacco:  Never  Vaping Use   Vaping status: Never Used  Substance Use Topics   Alcohol use: Not Currently   Drug use: Never     Allergies   Rosuvastatin and Sulfa antibiotics   Review of Systems Review of Systems  Constitutional:  Negative for fatigue and fever.  Respiratory:  Negative for shortness of breath.   Cardiovascular:  Negative for chest pain.  Musculoskeletal:  Positive for joint swelling. Negative for gait problem.  Skin:  Negative for color change and wound.  Neurological:  Negative for weakness and numbness.     Physical Exam Triage Vital Signs ED Triage Vitals  Encounter Vitals Group     BP      Systolic BP Percentile      Diastolic BP Percentile      Pulse      Resp      Temp      Temp src      SpO2      Weight      Height      Head Circumference      Peak Flow      Pain Score      Pain Loc      Pain Education      Exclude from Growth Chart    No data found.  Updated Vital Signs BP (!) 151/79 (BP Location: Right Arm)   Pulse (!) 58   Temp 98.7 F (37.1 C) (Oral)   Resp 16   Ht 5\' 8"  (1.727 m)   Wt 195 lb 1.7 oz (88.5 kg)   SpO2 96%   BMI 29.67 kg/m      Physical Exam Vitals and nursing note reviewed.  Constitutional:      General: He is not in acute distress.    Appearance: Normal appearance. He is well-developed. He is not ill-appearing.  HENT:     Head: Normocephalic and atraumatic.  Eyes:     General: No scleral icterus.    Conjunctiva/sclera: Conjunctivae normal.  Cardiovascular:     Rate and Rhythm: Regular rhythm. Bradycardia present.     Heart sounds: Normal heart sounds.  Pulmonary:     Effort: Pulmonary effort is normal. No respiratory distress.     Breath sounds: Normal breath sounds.  Musculoskeletal:     Cervical back: Neck supple.     Comments: RIGHT LOWER LEG: No significant  swelling. No increased warmth or pallor. No deformity, abrasion, laceration.  No erythema. 5/5 strength bilateral lower extremities. TTP calf  posterior. 14.5 inch calf on right and 14 in on left.  Skin:    General: Skin is warm and dry.     Capillary Refill: Capillary refill takes less than 2 seconds.  Neurological:     General: No focal deficit present.     Mental Status: He is alert. Mental status is at baseline.     Motor: No weakness.     Gait: Gait normal.  Psychiatric:        Mood and Affect: Mood normal.        Behavior: Behavior normal.      UC Treatments / Results  Labs (all labs ordered are listed, but only abnormal results are displayed) Labs Reviewed - No data to display  EKG   Radiology No results found.  Procedures Procedures (including critical care time)  Medications Ordered in UC Medications - No data to display  Initial Impression / Assessment and Plan / UC Course  I have reviewed the triage vital signs and the nursing notes.  Pertinent labs & imaging results that were available during my care of the patient were reviewed by me and considered in my medical decision making (see chart for details).   82 y/o male with history of active prostate cancer, CAD with CABG, HTN, PAD,and T2DM presents for right calf pain, swelling and calf fatigue since yesterday. No injury. Reports going up and down ladder the day symptoms started.  On exam no visible swelling but he does have measurable swelling 14.5 inch on right compared to 14 in on left. Calf is tender. No erythema. Good pulses and strength.   Possible DVT. He does have risk factors. Wells score 2. Advised patient he likely needs [place] to rule out DVT and suggested ED. He plans to contact PCP for guidance first. We discussed it could be due to PAD or calf strain as well. Reviewed ED precautions with patient and wife.    Final Clinical Impressions(s) / UC Diagnoses   Final diagnoses:  Right calf pain  Calf swelling     Discharge Instructions      -You do have mild amount of measurable swelling of the right calf compared to the left. -I  cannot rule out a blood clot/DVT. You may need an ultrasound. I would advise going to ED for this especially if continued swelling, pain, redness, weakness. -This could also be related to blockage of artery -This may  be a simple muscle strain. Tylenol, ice and heat may help. -Reach out to PCP for appointment. I printed more information about DVT. If still concerned go to ER please.     ED Prescriptions   None    PDMP not reviewed this encounter.   Shirlee Latch, PA-C 03/31/23 1037

## 2023-03-31 NOTE — Discharge Instructions (Signed)
-  You do have mild amount of measurable swelling of the right calf compared to the left. -I cannot rule out a blood clot/DVT. You may need an ultrasound. I would advise going to ED for this especially if continued swelling, pain, redness, weakness. -This could also be related to blockage of artery -This may  be a simple muscle strain. Tylenol, ice and heat may help. -Reach out to PCP for appointment. I printed more information about DVT. If still concerned go to ER please.

## 2023-03-31 NOTE — ED Triage Notes (Signed)
Pt c/o right calf pain. Started yesterday. He states it just felt tired and noticed some swelling in the ankle. No bruising or discoloration. Pain is worse when he is walking.

## 2023-04-03 ENCOUNTER — Other Ambulatory Visit: Payer: Self-pay

## 2023-04-03 ENCOUNTER — Ambulatory Visit
Admission: RE | Admit: 2023-04-03 | Discharge: 2023-04-03 | Disposition: A | Discharge status: Home / Self Care | Payer: Medicare Other | Source: Ambulatory Visit | Attending: Radiation Oncology | Admitting: Radiation Oncology

## 2023-04-03 DIAGNOSIS — C61 Malignant neoplasm of prostate: Secondary | ICD-10-CM | POA: Diagnosis not present

## 2023-04-03 LAB — RAD ONC ARIA SESSION SUMMARY
Course Elapsed Days: 42
Plan Fractions Treated to Date: 29
Plan Prescribed Dose Per Fraction: 2 Gy
Plan Total Fractions Prescribed: 40
Plan Total Prescribed Dose: 80 Gy
Reference Point Dosage Given to Date: 58 Gy
Reference Point Session Dosage Given: 2 Gy
Session Number: 29

## 2023-04-04 ENCOUNTER — Other Ambulatory Visit: Payer: Self-pay

## 2023-04-04 ENCOUNTER — Ambulatory Visit: Admission: RE | Admit: 2023-04-04 | Payer: Medicare Other | Source: Ambulatory Visit

## 2023-04-04 DIAGNOSIS — C61 Malignant neoplasm of prostate: Secondary | ICD-10-CM | POA: Diagnosis not present

## 2023-04-04 LAB — RAD ONC ARIA SESSION SUMMARY
Course Elapsed Days: 43
Plan Fractions Treated to Date: 30
Plan Prescribed Dose Per Fraction: 2 Gy
Plan Total Fractions Prescribed: 40
Plan Total Prescribed Dose: 80 Gy
Reference Point Dosage Given to Date: 60 Gy
Reference Point Session Dosage Given: 2 Gy
Session Number: 30

## 2023-04-05 ENCOUNTER — Inpatient Hospital Stay: Payer: Medicare Other

## 2023-04-05 ENCOUNTER — Ambulatory Visit
Admission: RE | Admit: 2023-04-05 | Discharge: 2023-04-05 | Disposition: A | Discharge status: Home / Self Care | Payer: Medicare Other | Source: Ambulatory Visit | Attending: Radiation Oncology | Admitting: Radiation Oncology

## 2023-04-05 ENCOUNTER — Other Ambulatory Visit: Payer: Self-pay

## 2023-04-05 DIAGNOSIS — C61 Malignant neoplasm of prostate: Secondary | ICD-10-CM | POA: Diagnosis not present

## 2023-04-05 LAB — RAD ONC ARIA SESSION SUMMARY
Course Elapsed Days: 44
Plan Fractions Treated to Date: 31
Plan Prescribed Dose Per Fraction: 2 Gy
Plan Total Fractions Prescribed: 40
Plan Total Prescribed Dose: 80 Gy
Reference Point Dosage Given to Date: 62 Gy
Reference Point Session Dosage Given: 2 Gy
Session Number: 31

## 2023-04-05 LAB — CBC (CANCER CENTER ONLY)
HCT: 40.7 % (ref 39.0–52.0)
Hemoglobin: 13.2 g/dL (ref 13.0–17.0)
MCH: 28 pg (ref 26.0–34.0)
MCHC: 32.4 g/dL (ref 30.0–36.0)
MCV: 86.4 fL (ref 80.0–100.0)
Platelet Count: 197 10*3/uL (ref 150–400)
RBC: 4.71 MIL/uL (ref 4.22–5.81)
RDW: 14.4 % (ref 11.5–15.5)
WBC Count: 5.7 10*3/uL (ref 4.0–10.5)
nRBC: 0 % (ref 0.0–0.2)

## 2023-04-06 ENCOUNTER — Other Ambulatory Visit: Payer: Self-pay | Admitting: Gerontology

## 2023-04-06 ENCOUNTER — Ambulatory Visit
Admission: RE | Admit: 2023-04-06 | Discharge: 2023-04-06 | Disposition: A | Discharge status: Home / Self Care | Payer: Medicare Other | Source: Ambulatory Visit | Attending: Gerontology | Admitting: Gerontology

## 2023-04-06 ENCOUNTER — Other Ambulatory Visit: Payer: Self-pay

## 2023-04-06 ENCOUNTER — Ambulatory Visit
Admission: RE | Admit: 2023-04-06 | Discharge: 2023-04-06 | Disposition: A | Discharge status: Home / Self Care | Payer: Medicare Other | Source: Ambulatory Visit | Attending: Radiation Oncology | Admitting: Radiation Oncology

## 2023-04-06 DIAGNOSIS — C61 Malignant neoplasm of prostate: Secondary | ICD-10-CM | POA: Diagnosis not present

## 2023-04-06 DIAGNOSIS — M79604 Pain in right leg: Secondary | ICD-10-CM

## 2023-04-06 LAB — RAD ONC ARIA SESSION SUMMARY
Course Elapsed Days: 45
Plan Fractions Treated to Date: 32
Plan Prescribed Dose Per Fraction: 2 Gy
Plan Total Fractions Prescribed: 40
Plan Total Prescribed Dose: 80 Gy
Reference Point Dosage Given to Date: 64 Gy
Reference Point Session Dosage Given: 2 Gy
Session Number: 32

## 2023-04-07 ENCOUNTER — Other Ambulatory Visit: Payer: Self-pay

## 2023-04-07 ENCOUNTER — Ambulatory Visit
Admission: RE | Admit: 2023-04-07 | Discharge: 2023-04-07 | Disposition: A | Discharge status: Home / Self Care | Payer: Medicare Other | Source: Ambulatory Visit | Attending: Radiation Oncology | Admitting: Radiation Oncology

## 2023-04-07 DIAGNOSIS — C61 Malignant neoplasm of prostate: Secondary | ICD-10-CM | POA: Diagnosis not present

## 2023-04-07 LAB — RAD ONC ARIA SESSION SUMMARY
Course Elapsed Days: 46
Plan Fractions Treated to Date: 33
Plan Prescribed Dose Per Fraction: 2 Gy
Plan Total Fractions Prescribed: 40
Plan Total Prescribed Dose: 80 Gy
Reference Point Dosage Given to Date: 66 Gy
Reference Point Session Dosage Given: 2 Gy
Session Number: 33

## 2023-04-10 ENCOUNTER — Other Ambulatory Visit: Payer: Self-pay

## 2023-04-10 ENCOUNTER — Ambulatory Visit: Admission: RE | Admit: 2023-04-10 | Payer: Medicare Other | Source: Ambulatory Visit

## 2023-04-10 DIAGNOSIS — C61 Malignant neoplasm of prostate: Secondary | ICD-10-CM | POA: Diagnosis not present

## 2023-04-10 LAB — RAD ONC ARIA SESSION SUMMARY
Course Elapsed Days: 49
Plan Fractions Treated to Date: 34
Plan Prescribed Dose Per Fraction: 2 Gy
Plan Total Fractions Prescribed: 40
Plan Total Prescribed Dose: 80 Gy
Reference Point Dosage Given to Date: 68 Gy
Reference Point Session Dosage Given: 2 Gy
Session Number: 34

## 2023-04-11 ENCOUNTER — Ambulatory Visit: Admission: RE | Admit: 2023-04-11 | Payer: Medicare Other | Source: Ambulatory Visit

## 2023-04-11 ENCOUNTER — Other Ambulatory Visit: Payer: Self-pay

## 2023-04-11 DIAGNOSIS — C61 Malignant neoplasm of prostate: Secondary | ICD-10-CM | POA: Diagnosis not present

## 2023-04-11 LAB — RAD ONC ARIA SESSION SUMMARY
Course Elapsed Days: 50
Plan Fractions Treated to Date: 35
Plan Prescribed Dose Per Fraction: 2 Gy
Plan Total Fractions Prescribed: 40
Plan Total Prescribed Dose: 80 Gy
Reference Point Dosage Given to Date: 70 Gy
Reference Point Session Dosage Given: 2 Gy
Session Number: 35

## 2023-04-12 ENCOUNTER — Other Ambulatory Visit: Payer: Self-pay

## 2023-04-12 ENCOUNTER — Inpatient Hospital Stay: Payer: Medicare Other

## 2023-04-12 ENCOUNTER — Ambulatory Visit: Admission: RE | Admit: 2023-04-12 | Payer: Medicare Other | Source: Ambulatory Visit

## 2023-04-12 DIAGNOSIS — C61 Malignant neoplasm of prostate: Secondary | ICD-10-CM | POA: Diagnosis not present

## 2023-04-12 LAB — RAD ONC ARIA SESSION SUMMARY
Course Elapsed Days: 51
Plan Fractions Treated to Date: 36
Plan Prescribed Dose Per Fraction: 2 Gy
Plan Total Fractions Prescribed: 40
Plan Total Prescribed Dose: 80 Gy
Reference Point Dosage Given to Date: 72 Gy
Reference Point Session Dosage Given: 2 Gy
Session Number: 36

## 2023-04-13 ENCOUNTER — Other Ambulatory Visit: Payer: Self-pay

## 2023-04-13 ENCOUNTER — Ambulatory Visit
Admission: RE | Admit: 2023-04-13 | Discharge: 2023-04-13 | Disposition: A | Discharge status: Home / Self Care | Payer: Medicare Other | Source: Ambulatory Visit | Attending: Radiation Oncology | Admitting: Radiation Oncology

## 2023-04-13 DIAGNOSIS — C61 Malignant neoplasm of prostate: Secondary | ICD-10-CM | POA: Diagnosis present

## 2023-04-13 LAB — RAD ONC ARIA SESSION SUMMARY
Course Elapsed Days: 52
Plan Fractions Treated to Date: 37
Plan Prescribed Dose Per Fraction: 2 Gy
Plan Total Fractions Prescribed: 40
Plan Total Prescribed Dose: 80 Gy
Reference Point Dosage Given to Date: 74 Gy
Reference Point Session Dosage Given: 2 Gy
Session Number: 37

## 2023-04-14 ENCOUNTER — Ambulatory Visit
Admission: RE | Admit: 2023-04-14 | Discharge: 2023-04-14 | Disposition: A | Discharge status: Home / Self Care | Payer: Medicare Other | Source: Ambulatory Visit | Attending: Radiation Oncology | Admitting: Radiation Oncology

## 2023-04-14 ENCOUNTER — Other Ambulatory Visit: Payer: Self-pay

## 2023-04-14 DIAGNOSIS — C61 Malignant neoplasm of prostate: Secondary | ICD-10-CM | POA: Diagnosis not present

## 2023-04-14 LAB — RAD ONC ARIA SESSION SUMMARY
Course Elapsed Days: 53
Plan Fractions Treated to Date: 38
Plan Prescribed Dose Per Fraction: 2 Gy
Plan Total Fractions Prescribed: 40
Plan Total Prescribed Dose: 80 Gy
Reference Point Dosage Given to Date: 76 Gy
Reference Point Session Dosage Given: 2 Gy
Session Number: 38

## 2023-04-17 ENCOUNTER — Other Ambulatory Visit: Payer: Self-pay

## 2023-04-17 ENCOUNTER — Ambulatory Visit
Admission: RE | Admit: 2023-04-17 | Discharge: 2023-04-17 | Disposition: A | Discharge status: Home / Self Care | Payer: Medicare Other | Source: Ambulatory Visit | Attending: Radiation Oncology | Admitting: Radiation Oncology

## 2023-04-17 ENCOUNTER — Ambulatory Visit: Payer: Medicare Other

## 2023-04-17 DIAGNOSIS — C61 Malignant neoplasm of prostate: Secondary | ICD-10-CM | POA: Diagnosis not present

## 2023-04-17 LAB — RAD ONC ARIA SESSION SUMMARY
Course Elapsed Days: 56
Plan Fractions Treated to Date: 39
Plan Prescribed Dose Per Fraction: 2 Gy
Plan Total Fractions Prescribed: 40
Plan Total Prescribed Dose: 80 Gy
Reference Point Dosage Given to Date: 78 Gy
Reference Point Session Dosage Given: 2 Gy
Session Number: 39

## 2023-04-18 ENCOUNTER — Other Ambulatory Visit: Payer: Self-pay

## 2023-04-18 ENCOUNTER — Ambulatory Visit
Admission: RE | Admit: 2023-04-18 | Discharge: 2023-04-18 | Disposition: A | Discharge status: Home / Self Care | Payer: Medicare Other | Source: Ambulatory Visit | Attending: Radiation Oncology | Admitting: Radiation Oncology

## 2023-04-18 DIAGNOSIS — C61 Malignant neoplasm of prostate: Secondary | ICD-10-CM | POA: Diagnosis not present

## 2023-04-18 LAB — RAD ONC ARIA SESSION SUMMARY
Course Elapsed Days: 57
Plan Fractions Treated to Date: 40
Plan Prescribed Dose Per Fraction: 2 Gy
Plan Total Fractions Prescribed: 40
Plan Total Prescribed Dose: 80 Gy
Reference Point Dosage Given to Date: 80 Gy
Reference Point Session Dosage Given: 2 Gy
Session Number: 40

## 2023-04-21 ENCOUNTER — Telehealth (INDEPENDENT_AMBULATORY_CARE_PROVIDER_SITE_OTHER): Payer: Self-pay

## 2023-04-24 NOTE — Telephone Encounter (Signed)
Pt scheduled  

## 2023-05-12 ENCOUNTER — Telehealth: Payer: Self-pay

## 2023-05-12 NOTE — Telephone Encounter (Signed)
Patient request to change upcoming appointment please give him at call at [phone]

## 2023-05-25 ENCOUNTER — Ambulatory Visit: Payer: Medicare Other | Admitting: Radiation Oncology

## 2023-05-26 ENCOUNTER — Encounter (INDEPENDENT_AMBULATORY_CARE_PROVIDER_SITE_OTHER): Payer: Medicare Other

## 2023-05-26 ENCOUNTER — Encounter (INDEPENDENT_AMBULATORY_CARE_PROVIDER_SITE_OTHER): Payer: Medicare Other | Admitting: Nurse Practitioner

## 2023-05-29 ENCOUNTER — Ambulatory Visit
Admission: RE | Admit: 2023-05-29 | Discharge: 2023-05-29 | Disposition: A | Discharge status: Home / Self Care | Payer: Medicare Other | Source: Ambulatory Visit | Attending: Radiation Oncology | Admitting: Radiation Oncology

## 2023-05-29 ENCOUNTER — Other Ambulatory Visit: Payer: Self-pay | Admitting: *Deleted

## 2023-05-29 ENCOUNTER — Encounter: Payer: Self-pay | Admitting: Radiation Oncology

## 2023-05-29 VITALS — BP 144/81 | HR 89 | Temp 98.0°F | Resp 16 | Wt 200.0 lb

## 2023-05-29 DIAGNOSIS — C61 Malignant neoplasm of prostate: Secondary | ICD-10-CM | POA: Diagnosis present

## 2023-05-29 DIAGNOSIS — Z923 Personal history of irradiation: Secondary | ICD-10-CM | POA: Diagnosis not present

## 2023-05-29 DIAGNOSIS — I739 Peripheral vascular disease, unspecified: Secondary | ICD-10-CM | POA: Insufficient documentation

## 2023-05-29 NOTE — Progress Notes (Signed)
Radiation Oncology Follow up Note  Name: Jake Hudson   Date:   05/29/2023 MRN:  [id number] DOB: Nov 28, 1940    This 82 y.o. 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  REFERRING PROVIDER: [name], MD  HPI: Patient is an 82 year old male now out 1 month having completed image guided IMRT radiation therapy for Gleason 7 adenocarcinoma seen today in routine follow-up he is doing well.  He specifically denies any increased lower urinary tract symptoms diarrhea or fatigue he has recently had a.  Angioplasty for peripheral vascular disease which she has done well from.  His legs are still somewhat sore.  COMPLICATIONS OF TREATMENT: none  FOLLOW UP COMPLIANCE: keeps appointments   PHYSICAL EXAM:  BP (!) 144/81 Comment: Patient in pain from vascular procedure  Pulse 89   Temp 98 F (36.7 C) (Tympanic)   Resp 16   Wt 200 lb (90.7 kg)   BMI 30.41 kg/m  Well-developed well-nourished patient in NAD. HEENT reveals PERLA, EOMI, discs not visualized.  Oral cavity is clear. No oral mucosal lesions are identified. Neck is clear without evidence of cervical or supraclavicular adenopathy. Lungs are clear to A&P. Cardiac examination is essentially unremarkable with regular rate and rhythm without murmur rub or thrill. Abdomen is benign with no organomegaly or masses noted. Motor sensory and DTR levels are equal and symmetric in the upper and lower extremities. Cranial nerves II through XII are grossly intact. Proprioception is intact. No peripheral adenopathy or edema is identified. No motor or sensory levels are noted. Crude visual fields are within normal range.  RADIOLOGY RESULTS: No current films for review  PLAN: Present time patient is doing well low side effect profile from his recent image guided IMRT radiation  therapy.  Pleased with his overall progress.  Of asked to see him back in 3 months with a PSA prior to that visit.  Patient knows to call with any concerns.  I would like to take this opportunity to thank you for allowing me to participate in the care of your patient.Jake Miller, MD

## 2023-06-03 ENCOUNTER — Emergency Department
Admission: EM | Admit: 2023-06-03 | Discharge: 2023-06-04 | Disposition: A | Discharge status: Home / Self Care | Payer: Medicare Other | Attending: Emergency Medicine | Admitting: Emergency Medicine

## 2023-06-03 ENCOUNTER — Emergency Department: Payer: Medicare Other

## 2023-06-03 ENCOUNTER — Other Ambulatory Visit: Payer: Self-pay

## 2023-06-03 DIAGNOSIS — N179 Acute kidney failure, unspecified: Secondary | ICD-10-CM | POA: Diagnosis not present

## 2023-06-03 DIAGNOSIS — Z96643 Presence of artificial hip joint, bilateral: Secondary | ICD-10-CM | POA: Diagnosis not present

## 2023-06-03 DIAGNOSIS — Z79899 Other long term (current) drug therapy: Secondary | ICD-10-CM | POA: Insufficient documentation

## 2023-06-03 DIAGNOSIS — N183 Chronic kidney disease, stage 3 unspecified: Secondary | ICD-10-CM | POA: Insufficient documentation

## 2023-06-03 DIAGNOSIS — Z951 Presence of aortocoronary bypass graft: Secondary | ICD-10-CM | POA: Diagnosis not present

## 2023-06-03 DIAGNOSIS — Z7902 Long term (current) use of antithrombotics/antiplatelets: Secondary | ICD-10-CM | POA: Insufficient documentation

## 2023-06-03 DIAGNOSIS — T148XXA Other injury of unspecified body region, initial encounter: Secondary | ICD-10-CM

## 2023-06-03 DIAGNOSIS — I129 Hypertensive chronic kidney disease with stage 1 through stage 4 chronic kidney disease, or unspecified chronic kidney disease: Secondary | ICD-10-CM | POA: Insufficient documentation

## 2023-06-03 DIAGNOSIS — Z7984 Long term (current) use of oral hypoglycemic drugs: Secondary | ICD-10-CM | POA: Diagnosis not present

## 2023-06-03 DIAGNOSIS — I97638 Postprocedural hematoma of a circulatory system organ or structure following other circulatory system procedure: Secondary | ICD-10-CM | POA: Diagnosis present

## 2023-06-03 DIAGNOSIS — I2510 Atherosclerotic heart disease of native coronary artery without angina pectoris: Secondary | ICD-10-CM | POA: Insufficient documentation

## 2023-06-03 DIAGNOSIS — E1122 Type 2 diabetes mellitus with diabetic chronic kidney disease: Secondary | ICD-10-CM | POA: Insufficient documentation

## 2023-06-03 DIAGNOSIS — J45909 Unspecified asthma, uncomplicated: Secondary | ICD-10-CM | POA: Diagnosis not present

## 2023-06-03 LAB — CBC WITH DIFFERENTIAL/PLATELET
Abs Immature Granulocytes: 0.06 10*3/uL (ref 0.00–0.07)
Basophils Absolute: 0.1 10*3/uL (ref 0.0–0.1)
Basophils Relative: 1 %
Eosinophils Absolute: 0.2 10*3/uL (ref 0.0–0.5)
Eosinophils Relative: 3 %
HCT: 34.2 % — ABNORMAL LOW (ref 39.0–52.0)
Hemoglobin: 11 g/dL — ABNORMAL LOW (ref 13.0–17.0)
Immature Granulocytes: 1 %
Lymphocytes Relative: 21 %
Lymphs Abs: 1.3 10*3/uL (ref 0.7–4.0)
MCH: 28.6 pg (ref 26.0–34.0)
MCHC: 32.2 g/dL (ref 30.0–36.0)
MCV: 88.8 fL (ref 80.0–100.0)
Monocytes Absolute: 0.7 10*3/uL (ref 0.1–1.0)
Monocytes Relative: 11 %
Neutro Abs: 3.8 10*3/uL (ref 1.7–7.7)
Neutrophils Relative %: 63 %
Platelets: 252 10*3/uL (ref 150–400)
RBC: 3.85 MIL/uL — ABNORMAL LOW (ref 4.22–5.81)
RDW: 13.1 % (ref 11.5–15.5)
WBC: 6.2 10*3/uL (ref 4.0–10.5)
nRBC: 0 % (ref 0.0–0.2)

## 2023-06-03 NOTE — ED Notes (Signed)
[place] tech in room with pt.

## 2023-06-03 NOTE — ED Provider Notes (Signed)
[facility] Provider Note    Event Date/Time   First MD Initiated Contact with Patient 06/03/23 2305     (approximate)   History   Post-op Problem   HPI  Jake Hudson is a 82 y.o. male who presents to the ED from home with a chief complaint of postop swelling and oozing.  Patient had peripheral angiography status post thrombectomy/PTA of right popliteal and stenting of right peroneal artery on 05/25/2023.  Patient does take Plavix and never held it per his cardiologist instructions.  Reports issues with swelling since that time.  He was instructed by his doctor to apply pressure to the left groin op site for 10 minutes daily which she has done.  Tonight approximately 11 PM while watching TV he noted oozing from the site and was instructed to proceed to the nearest emergency department for evaluation.  Denies fever/chills, chest pain, shortness of breath, abdominal pain, nausea, vomiting or dizziness.  States overall his right leg feels better after the procedure.     Past Medical History   Past Medical History:  Diagnosis Date   Aortic atherosclerosis (HCC)    Arthritis    Asthma    Benign prostatic hyperplasia with elevated prostate specific antigen (PSA)    Bilateral lower extremity edema    CAD (coronary artery disease)    a.) 5v CABG 2014; b.) LHC 07/08/2020: 100% p-mLCx, 100% oLAD, 100% OM1, 100% p-mRCA, 70% o-pRCA, 70% D1; LIMA-LAD patent, SVG-PDA patent, SVG-OM 2 patent, SVG-D1 with 70% stenosis in the native vessel, SVG-OM1 occluded --> med mgmt.   CKD (chronic kidney disease), stage III (HCC)    Depression    Diastolic dysfunction    Erectile dysfunction    a.) on PDE5i (tadalafil)   Family history of adverse reaction to anesthesia    a.) delayed emergence in 1st degree relative (father)   GERD (gastroesophageal reflux disease)    History of bilateral cataract extraction 2023   History of kidney stones    Hyperlipidemia     Hypertension    Lumbar radiculopathy    Memory impairment    OSA (obstructive sleep apnea)    a.) unable to tolerate mask required for nocturnal PAP therapy   Osteoarthritis    PAF (paroxysmal atrial fibrillation) (HCC)    a.) CHA2DS2VASc = 5 (age x 2, HTN, vascular disease history, T2DM); b.) rate/rhythm maintained on oral metoprolol succinate; no chronic anticoagulation   PVC's (premature ventricular contractions)    Right inguinal hernia    S/P CABG x 5 2014   a.) done while living on [place]; LIMA-LAD, reverse SVG-diagonal, reverse SVG-OM1, reverse SVG-OM2, reverse SVG-PDA; procedure complicated by postoperative atrial fibrillation   T2DM (type 2 diabetes mellitus) (HCC)    Umbilical hernia      Active Problem List   Patient Active Problem List   Diagnosis Date Noted   Cardiovascular stress test abnormal 04/05/2019   Electrocardiogram abnormal 04/05/2019   Pleural effusion 04/05/2019   Ventricular premature beats 04/05/2019   Elevated PSA 09/21/2018   Erectile dysfunction due to arterial insufficiency 09/21/2018   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 08/07/2018   Angina pectoris (HCC) 06/14/2018   Atrial fibrillation (HCC) 06/14/2018   Benign prostatic hyperplasia with lower urinary tract symptoms 06/14/2018   CKD (chronic kidney disease) stage 3, GFR 30-59 ml/min (HCC) 06/14/2018   Coronary arteriosclerosis 06/14/2018   DM (diabetes mellitus), type 2 (HCC) 06/14/2018   Fatigue 06/14/2018   Essential hypertension 06/14/2018  Hyperlipidemia 06/14/2018   Mitral valve regurgitation 06/14/2018   Obesity (BMI 30-39.9) 06/14/2018   Obstructive sleep apnea syndrome 06/14/2018   Ventricular premature complex 06/14/2018   Primary osteoarthritis of right hip 04/04/2018   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 04/03/2018     Past Surgical History   Past Surgical History:  Procedure Laterality Date   BACK SURGERY     CARPAL TUNNEL RELEASE      CATARACT EXTRACTION W/PHACO Left 08/08/2022   Procedure: CATARACT EXTRACTION PHACO AND INTRAOCULAR LENS PLACEMENT (IOC) LEFT DIABETIC CLARION VIVITY TORIC LENS;  Surgeon: Nevada Crane, MD;  Location: [facility];  Service: Ophthalmology;  Laterality: Left;  7.19 0:45.8   CATARACT EXTRACTION W/PHACO Right 08/22/2022   Procedure: CATARACT EXTRACTION PHACO AND INTRAOCULAR LENS PLACEMENT (IOC) RIGHT DIABETIC CLAREON VIVITY LENS  4.60  00:36.0;  Surgeon: Nevada Crane, MD;  Location: [facility];  Service: Ophthalmology;  Laterality: Right;  Diabetic   CHOLECYSTECTOMY     CORONARY ARTERY BYPASS GRAFT N/A 2014   Procedure: 5 VESSEL CORONARY ARTERY BYPASS GRAFT; Location: [place]   CYST REMOVAL LEG     CYSTOSCOPY W/ URETEROSCOPY W/ LITHOTRIPSY     2017   HERNIA REPAIR     KIDNEY STONE SURGERY     LEFT HEART CATH AND CORONARY ANGIOGRAPHY Left 07/08/2020   Procedure: LEFT HEART CATH AND CORONARY ANGIOGRAPHY;  Surgeon: Marcina Millard, MD;  Location: [facility];  Service: Cardiovascular;  Laterality: Left;   NASAL SEPTUM SURGERY     PROSTATE BIOPSY     PROSTATE BIOPSY N/A 12/20/2022   Procedure: PROSTATE BIOPSY;  Surgeon: [name], MD;  Location: [facility];  Service: Urology;  Laterality: N/A;   ROTATOR CUFF REPAIR     TONSILLECTOMY     TOTAL HIP ARTHROPLASTY Bilateral    TRANSRECTAL ULTRASOUND N/A 12/20/2022   Procedure: TRANSRECTAL ULTRASOUND;  Surgeon: [name], MD;  Location: [facility];  Service: Urology;  Laterality: N/A;     Home Medications   Prior to Admission medications   Medication Sig Start Date End Date Taking? Authorizing Provider  acetaminophen (TYLENOL) 500 MG tablet Take 650 mg by mouth 3 (three) times daily.    [provider]  busPIRone (BUSPAR) 15 MG tablet Take 15 mg by mouth 2 (two) times daily.    [provider]  cholecalciferol (VITAMIN D3) 25 MCG (1000 UNIT) tablet Take 1,000 Units by mouth daily.     [provider]  cilostazol (PLETAL) 100 MG tablet Take 100 mg by mouth 2 (two) times daily. 04/29/23   [provider]  clopidogrel (PLAVIX) 75 MG tablet Take 1 tablet by mouth daily. 04/29/23   [provider]  Dulaglutide (TRULICITY) 3 MG/0.5ML SOPN Inject 3 mg into the skin every 7 (seven) days.    [provider]  finasteride (PROSCAR) 5 MG tablet TAKE (1) TABLET BY MOUTH EVERY DAY Patient taking differently: Take 5 mg by mouth at bedtime. 09/21/22   Stoioff, Verna Czech, MD  fluticasone (FLONASE) 50 MCG/ACT nasal spray Place 1 spray into both nostrils daily. 02/13/23   Radford Pax, NP  gabapentin (NEURONTIN) 100 MG capsule Take 500 mg by mouth at bedtime. 09/03/18   [provider]  glimepiride (AMARYL) 4 MG tablet Take 4 mg by mouth 2 (two) times daily.  06/09/18   [provider]  glucose blood test strip USE 3 TIMES DAILY 01/25/19   [provider]  hydrochlorothiazide (HYDRODIURIL) 25 MG tablet Take by mouth. 07/24/20   [provider]  hydrocortisone (ANUSOL-HC) 2.5 % rectal cream Place 1 Application rectally 2 (two) times daily. 03/07/23   [name], MD  LYSINE PO Take 3 capsules by mouth daily as needed (immune support).    [provider]  metFORMIN (GLUCOPHAGE) 500 MG tablet Take 500 mg by mouth 2 (two) times daily.    [provider]  metoprolol succinate (TOPROL-XL) 25 MG 24 hr tablet Take 12.5 mg by mouth daily. 03/14/19   [provider]  Misc Natural Products (PROSTATE HEALTH PO) Take 1 tablet by mouth in the morning and at bedtime.     [provider]  MOUNJARO 15 MG/0.5ML Pen SMARTSIG:15 Milligram(s) SUB-Q Once a Week    [provider]  pantoprazole (PROTONIX) 40 MG tablet Take 40 mg by mouth daily.    [provider]  potassium chloride (KLOR-CON) 10 MEQ tablet Take by mouth. 07/24/20 05/29/23  [provider]  tadalafil (CIALIS) 20 MG tablet  TAKE ONE TABLET BY MOUTH AS NEEDED 1 HOUR PRIOR TO INTERCOURSE 08/24/22   Stoioff, Verna Czech, MD  tamsulosin (FLOMAX) 0.4 MG CAPS capsule Take 0.4 mg by mouth 2 (two) times daily.  09/03/18   [provider]     Allergies  Rosuvastatin and Sulfa antibiotics   Family History  No family history on file.   Physical Exam  Triage Vital Signs: ED Triage Vitals [06/03/23 2059]  Encounter Vitals Group     BP (!) 178/65     Systolic BP Percentile      Diastolic BP Percentile      Pulse Rate 71     Resp 16     Temp 98.6 F (37 C)     Temp src      SpO2 97 %     Weight      Height      Head Circumference      Peak Flow      Pain Score 0     Pain Loc      Pain Education      Exclude from Growth Chart     Updated Vital Signs: BP (!) 178/65 (BP Location: Right Arm)   Pulse 71   Temp 98.6 F (37 C)   Resp 16   SpO2 97%    General: Awake, no distress.  CV:  RRR.  Good peripheral perfusion.  Resp:  Normal effort.  CTAB. Abd:  No distention.  Other:  BLE with palpable femoral and distal pulses.  Symmetrically warm legs without evidence of ischemia.  Brisk capillary refill.  Left groin: Very slight oozing from minimal incision scar.  Hardened area of swelling noted.  No testicular pain or swelling.   ED Results / Procedures / Treatments  Labs (all labs ordered are listed, but only abnormal results are displayed) Labs Reviewed - No data to display   EKG  None   RADIOLOGY I have independently visualized and interpreted patient's ultrasound as well as noted the radiology interpretation:  {You MUST document your own interpretation of imaging, as well as the fact that you reviewed the radiologist's report!:1}  Official radiology report(s): No results found.   PROCEDURES:  Critical Care performed: {CriticalCareYesNo:19197::"Yes, see critical care procedure note(s)","No"}  Procedures   MEDICATIONS ORDERED IN ED: Medications - No data to  display   IMPRESSION / MDM / ASSESSMENT AND PLAN / ED COURSE  I reviewed the triage vital signs  and the nursing notes.                             82 year old male presenting with left groin postop swelling and oozing approximately 9 days status post procedure.  Differential diagnosis includes but is not limited to pseudoaneurysm, abscess, hematoma, etc.  I personally reviewed patient's records and note his op note from 05/25/2023.  Patient's presentation is most consistent with acute presentation with potential threat to life or bodily function.  Will obtain basic lab work, ultrasound to evaluate for pseudoaneurysm.  Patient denies pain at this time.      FINAL CLINICAL IMPRESSION(S) / ED DIAGNOSES   Final diagnoses:  None     Rx / DC Orders   ED Discharge Orders     None        Note:  This document was prepared using Dragon voice recognition software and may include unintentional dictation errors.

## 2023-06-03 NOTE — ED Triage Notes (Signed)
Pt reports angio aortogram on 9/12 and reports  "knot" that formed a couple days ago and was instructed to apply pressure to area. Pt reports tonight around 830pm he noticed oozing of blood from the site on his left groin. Bleeding is controlled at this time and pt denies any significant blood loss.

## 2023-06-04 DIAGNOSIS — I97638 Postprocedural hematoma of a circulatory system organ or structure following other circulatory system procedure: Secondary | ICD-10-CM | POA: Diagnosis not present

## 2023-06-04 LAB — BASIC METABOLIC PANEL
Anion gap: 10 (ref 5–15)
BUN: 25 mg/dL — ABNORMAL HIGH (ref 8–23)
CO2: 27 mmol/L (ref 22–32)
Calcium: 9.5 mg/dL (ref 8.9–10.3)
Chloride: 106 mmol/L (ref 98–111)
Creatinine, Ser: 1.45 mg/dL — ABNORMAL HIGH (ref 0.61–1.24)
GFR, Estimated: 48 mL/min — ABNORMAL LOW (ref >60)
Glucose, Bld: 144 mg/dL — ABNORMAL HIGH (ref 70–99)
Potassium: 4.2 mmol/L (ref 3.5–5.1)
Sodium: 143 mmol/L (ref 135–145)

## 2023-06-04 MED ORDER — SODIUM CHLORIDE 0.9 % IV BOLUS
1000.0000 mL | Freq: Once | INTRAVENOUS | Status: AC
  Administered 2023-06-04: 1000 mL via INTRAVENOUS

## 2023-06-04 NOTE — Discharge Instructions (Signed)
Continue taking Plavix daily as directed by your doctor.  Apply cool pressure to affected area several times daily x 10 minutes at a time.  Return to the ER for worsening symptoms, fever, hemorrhage or other concerns.

## 2023-08-23 ENCOUNTER — Inpatient Hospital Stay: Payer: Medicare Other | Attending: Radiation Oncology

## 2023-08-23 DIAGNOSIS — C61 Malignant neoplasm of prostate: Secondary | ICD-10-CM | POA: Diagnosis present

## 2023-08-23 LAB — PSA: Prostatic Specific Antigen: <0.01 ng/mL (ref 0.00–4.00)

## 2023-08-30 ENCOUNTER — Ambulatory Visit
Admission: RE | Admit: 2023-08-30 | Discharge: 2023-08-30 | Disposition: A | Discharge status: Home / Self Care | Payer: Medicare Other | Source: Ambulatory Visit | Attending: Radiation Oncology | Admitting: Radiation Oncology

## 2023-08-30 ENCOUNTER — Encounter: Payer: Self-pay | Admitting: Urology

## 2023-08-30 ENCOUNTER — Encounter: Payer: Self-pay | Admitting: Radiation Oncology

## 2023-08-30 VITALS — BP 134/83 | HR 65 | Resp 20 | Ht 68.0 in | Wt 202.0 lb

## 2023-08-30 DIAGNOSIS — Z923 Personal history of irradiation: Secondary | ICD-10-CM | POA: Insufficient documentation

## 2023-08-30 DIAGNOSIS — E559 Vitamin D deficiency, unspecified: Secondary | ICD-10-CM | POA: Insufficient documentation

## 2023-08-30 DIAGNOSIS — R232 Flushing: Secondary | ICD-10-CM | POA: Diagnosis not present

## 2023-08-30 DIAGNOSIS — C61 Malignant neoplasm of prostate: Secondary | ICD-10-CM | POA: Diagnosis present

## 2023-08-30 NOTE — Progress Notes (Signed)
Radiation Oncology Follow up Note  Name: Jake Hudson   Date:   08/30/2023 MRN:  [id number] DOB: 11-19-1940    This 82 y.o. male presents to the clinic today for 64-month follow-up status post IMRT radiation therapy to his prostate for stage IIc Gleason 7 (4+3) adenocarcinoma presenting with a PSA of 5.7.  REFERRING PROVIDER: [name], MD  HPI: Patient is an 82 year old male now out for months having completed image guided IMRT radiation therapy for Gleason 7 adenocarcinoma the prostate seen today in routine follow-up he is doing well he states his stream is fair.  He does have nocturia approximately 3 times a night.  I have asked him about Flomax he does not recall if he is taking it or when he is taking it I have asked him to check on that.  Not really drinking any cranberry juice at this time.  He is having no significant diarrhea or pain.  His most recent PSA is less than 0.01.  Patient does complain of occasional hot flashes which have explained to him is related to his ADT therapy and that he should continue to take vitamin D supplements.  COMPLICATIONS OF TREATMENT: none  FOLLOW UP COMPLIANCE: keeps appointments   PHYSICAL EXAM:  BP 134/83   Pulse 65   Resp 20   Ht 5\' 8"  (1.727 m)   Wt 202 lb (91.6 kg)   BMI 30.71 kg/m  Well-developed well-nourished patient in NAD. HEENT reveals PERLA, EOMI, discs not visualized.  Oral cavity is clear. No oral mucosal lesions are identified. Neck is clear without evidence of cervical or supraclavicular adenopathy. Lungs are clear to A&P. Cardiac examination is essentially unremarkable with regular rate and rhythm without murmur rub or thrill. Abdomen is benign with no organomegaly or masses noted. Motor sensory and DTR levels are equal and symmetric in the upper and lower extremities. Cranial nerves II through XII are grossly intact. Proprioception is intact. No peripheral adenopathy or edema is identified. No motor or sensory  levels are noted. Crude visual fields are within normal range.  RADIOLOGY RESULTS: No current films for review  PLAN: Present time patient is under excellent biochemical control of his prostate cancer.  I am pleased with his overall progress.  He continues to have some hot flashes and I have again explained to him using vitamin D supplements.  We also asked him to drink as much light cranberry juice as possible.  I have also instructed him to start the Flomax again in the evenings after dinner.  I have asked to see him back in 6 months for follow-up with repeat PSA.  Patient knows to call with any concerns.  I would like to take this opportunity to thank you for allowing me to participate in the care of your patient.[name], MD

## 2024-01-30 ENCOUNTER — Encounter (INDEPENDENT_AMBULATORY_CARE_PROVIDER_SITE_OTHER): Payer: Self-pay

## 2024-02-21 ENCOUNTER — Inpatient Hospital Stay: Payer: Medicare Other | Attending: Radiation Oncology

## 2024-02-21 DIAGNOSIS — C61 Malignant neoplasm of prostate: Secondary | ICD-10-CM | POA: Insufficient documentation

## 2024-02-21 LAB — PSA: Prostatic Specific Antigen: 0.01 ng/mL (ref 0.00–4.00)

## 2024-02-28 ENCOUNTER — Ambulatory Visit
Admission: RE | Admit: 2024-02-28 | Discharge: 2024-02-28 | Disposition: A | Discharge status: Home / Self Care | Payer: Medicare Other | Source: Ambulatory Visit | Attending: Radiation Oncology | Admitting: Radiation Oncology

## 2024-02-28 ENCOUNTER — Encounter: Payer: Self-pay | Admitting: Radiation Oncology

## 2024-02-28 ENCOUNTER — Other Ambulatory Visit: Payer: Self-pay | Admitting: *Deleted

## 2024-02-28 VITALS — BP 134/86 | HR 68 | Temp 98.3°F | Wt 197.0 lb

## 2024-02-28 DIAGNOSIS — Z923 Personal history of irradiation: Secondary | ICD-10-CM | POA: Diagnosis not present

## 2024-02-28 DIAGNOSIS — C61 Malignant neoplasm of prostate: Secondary | ICD-10-CM | POA: Insufficient documentation

## 2024-02-28 DIAGNOSIS — E559 Vitamin D deficiency, unspecified: Secondary | ICD-10-CM | POA: Diagnosis not present

## 2024-02-28 DIAGNOSIS — R232 Flushing: Secondary | ICD-10-CM | POA: Insufficient documentation

## 2024-02-28 NOTE — Progress Notes (Signed)
 Radiation Oncology Follow up Note  Name: Jake Hudson   Date:   02/28/2024 MRN:  [id number] DOB: [birth year]    This 83 y.o. male presents to the clinic today for 6-month follow-up status post IMRT radiation therapy to his prostate for stage IIc Gleason 7 (4+3) adenocarcinoma presenting with a PSA of 5.7.  REFERRING PROVIDER: Lorrie Rothman, MD  HPI: Patient is an 83 year old male now out 10 months having completed image guided IMRT radiation therapy to his prostate for Gleason 7 adenocarcinoma.  Seen today in follow-up he continues to have increased urinary symptoms such as urgency frequency poor stream and some dysuria.  He is taking Flomax twice a day states he is drinking cranberry juice.  He has not yet been back to urology.  He also complains of persistent hot flashes and is taking vitamin D supplements.  Also complains of some discomfort in his lower extremities and some chills he has a history of.  Atherosclerosis with bypass surgery as well as lower extremity procedure in the past according to the patient.  Patient did have ultrasound of the lower extremities back in September 2024 showing his most recent PSA is 0.01 showing a 1.3 cm perivascular hematoma excellent biochemical control of his prostate cancer  COMPLICATIONS OF TREATMENT: none  FOLLOW UP COMPLIANCE: keeps appointments   PHYSICAL EXAM:  BP 134/86   Pulse 68   Temp 98.3 F (36.8 C) (Tympanic)   Wt 197 lb (89.4 kg)   BMI 29.95 kg/m  Well-developed well-nourished patient in NAD. HEENT reveals PERLA, EOMI, discs not visualized.  Oral cavity is clear. No oral mucosal lesions are identified. Neck is clear without evidence of cervical or supraclavicular adenopathy. Lungs are clear to A&P. Cardiac examination is essentially unremarkable with regular rate and rhythm without murmur rub or thrill. Abdomen is benign with no organomegaly or masses noted. Motor sensory and DTR levels are equal and symmetric in the  upper and lower extremities. Cranial nerves II through XII are grossly intact. Proprioception is intact. No peripheral adenopathy or edema is identified. No motor or sensory levels are noted. Crude visual fields are within normal range.  RADIOLOGY RESULTS: Lower extremity ultrasound and venous ultrasound reviewed  PLAN: Present time patient is under excellent biochemical control of his prostate cancer of asked him to reestablish care with Dr. Cherylene Corrente in urology for his urologic symptoms since his will need management in years to come.  He continues on his current regimen including vitamin D supplements as well as Flomax twice a day.  I have also asked him to recontact with his vascular surgeon as his symptoms in his lower extremities most likely vascular in nature and not related to his prior radiation treatments.  I have asked to see him back in 6 months for a follow-up PSA and will then start once year follow-up visits.  Patient comprehends my recommendations well.  I would like to take this opportunity to thank you for allowing me to participate in the care of your patient.Glenis Langdon, MD

## 2024-03-22 ENCOUNTER — Ambulatory Visit (INDEPENDENT_AMBULATORY_CARE_PROVIDER_SITE_OTHER): Admitting: Urology

## 2024-03-22 VITALS — BP 133/73 | HR 61 | Ht 68.0 in | Wt 197.0 lb

## 2024-03-22 DIAGNOSIS — N401 Enlarged prostate with lower urinary tract symptoms: Secondary | ICD-10-CM

## 2024-03-22 DIAGNOSIS — C61 Malignant neoplasm of prostate: Secondary | ICD-10-CM

## 2024-03-22 DIAGNOSIS — R399 Unspecified symptoms and signs involving the genitourinary system: Secondary | ICD-10-CM | POA: Diagnosis not present

## 2024-03-22 LAB — BLADDER SCAN AMB NON-IMAGING

## 2024-03-23 NOTE — Progress Notes (Unsigned)
 03/22/2024 2:05 PM   Jake Hudson May 16, 1941 [id number]  Referring provider: Jeffie Cheryl BRAVO, MD [address] [place],  [place] [address]  Chief Complaint  Patient presents with   Prostate Cancer   Urologic history: 1.  Prostate cancer Prostate biopsy 08/2020 PSA 7.4 with path showing focal atypia. MRI not performed secondary to bilateral hip replacement Repeat biopsy April 2024.;  PSA bump 11.0 (corrected for finasteride ) Pathology:.  2 cores Gleason 4+3 adenocarcinoma (80% pattern 4 and 1 core 3+3 adenocarcinoma. He elected IMRT + ADT; IMRT completed 04/18/2023, ADT started 02/02/2023 end on chart review it appears he only received one 24-month depot injection   HPI: Jake Hudson is a 83 y.o.  male who presents for prostate cancer follow-up.  He has not been seen by me since fiducial markers were placed 02/02/2023 and has had regular radiation oncology follow-up.  Had storage related voiding symptoms during course of treatment and presently on tamsulosin 0.8 mg daily He was having storage related voiding symptoms preprostate cancer diagnosis and states he was given Myrbetriq  samples to try however never started after his prostate cancer diagnosis He continues to have hot flashes and states a recent testosterone  level checked by Dr. Jeffie was near normal He has noted some muscle atrophy and a bothersome symptom he had been a chill sensation in his lower extremities and groin region.  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  Although he states an etiology of this symptom has not been identified for/neurology note was reviewed and it is felt the symptoms are most likely from nerve compression from lumbar stenosis as well as his polyneuropathy.  He is presently on gabapentin which is being titrated with consideration of  nortriptyline, Lyrica or alpha lipoic acid as other potential options Labs were reviewed and a total testosterone  level drawn March 2025 was < 3 ng/dL (a free testosterone  level was 6.6 pg/mL) Last PSA June 2025 was 0.01  PMH: Past Medical History:  Diagnosis Date   Aortic atherosclerosis (HCC)    Arthritis    Asthma    Benign prostatic hyperplasia with elevated prostate specific antigen (PSA)    Bilateral lower extremity edema    CAD (coronary artery disease)    a.) 5v CABG 2014; b.) LHC 07/08/2020: 100% p-mLCx, 100% oLAD, 100% OM1, 100% p-mRCA, 70% o-pRCA, 70% D1; LIMA-LAD patent, SVG-PDA patent, SVG-OM 2 patent, SVG-D1 with 70% stenosis in the native vessel, SVG-OM1 occluded --> med mgmt.   CKD (chronic kidney disease), stage III (HCC)    Depression    Diastolic dysfunction    Erectile dysfunction    a.) on PDE5i (tadalafil )   Family history of adverse reaction to anesthesia    a.) delayed emergence in 1st degree relative (father)   GERD (gastroesophageal reflux disease)    History of bilateral cataract extraction 2023   History of kidney stones    Hyperlipidemia    Hypertension    Lumbar radiculopathy    Memory impairment    OSA (obstructive sleep apnea)    a.) unable to tolerate mask required for nocturnal PAP therapy   Osteoarthritis    PAF (paroxysmal atrial fibrillation) (HCC)    a.) CHA2DS2VASc = 5 (age x 2, HTN, vascular disease history, T2DM); b.) rate/rhythm maintained on oral metoprolol succinate; no chronic anticoagulation   PVC's (premature ventricular contractions)  Right inguinal hernia    S/P CABG x 5 2014   a.) done while living on [place] ; LIMA-LAD, reverse SVG-diagonal, reverse SVG-OM1, reverse SVG-OM2, reverse SVG-PDA; procedure complicated by postoperative atrial fibrillation   T2DM (type 2 diabetes mellitus) (HCC)    Umbilical hernia     Surgical History: Past Surgical History:  Procedure Laterality Date   BACK SURGERY     CARPAL TUNNEL RELEASE      CATARACT EXTRACTION W/PHACO Left 08/08/2022   Procedure: CATARACT EXTRACTION PHACO AND INTRAOCULAR LENS PLACEMENT (IOC) LEFT DIABETIC CLARION VIVITY TORIC LENS;  Surgeon: Myrna Adine Anes, MD;  Location: [facility];  Service: Ophthalmology;  Laterality: Left;  7.19 0:45.8   CATARACT EXTRACTION W/PHACO Right 08/22/2022   Procedure: CATARACT EXTRACTION PHACO AND INTRAOCULAR LENS PLACEMENT (IOC) RIGHT DIABETIC CLAREON VIVITY LENS  4.60  00:36.0;  Surgeon: Myrna Adine Anes, MD;  Location: [facility];  Service: Ophthalmology;  Laterality: Right;  Diabetic   CHOLECYSTECTOMY     CORONARY ARTERY BYPASS GRAFT N/A 2014   Procedure: 5 VESSEL CORONARY ARTERY BYPASS GRAFT; Location: [place]    CYST REMOVAL LEG     CYSTOSCOPY W/ URETEROSCOPY W/ LITHOTRIPSY     2017   HERNIA REPAIR     KIDNEY STONE SURGERY     LEFT HEART CATH AND CORONARY ANGIOGRAPHY Left 07/08/2020   Procedure: LEFT HEART CATH AND CORONARY ANGIOGRAPHY;  Surgeon: Ammon Blunt, MD;  Location: [facility];  Service: Cardiovascular;  Laterality: Left;   NASAL SEPTUM SURGERY     PROSTATE BIOPSY     PROSTATE BIOPSY N/A 12/20/2022   Procedure: PROSTATE BIOPSY;  Surgeon: Twylla Glendia BROCKS, MD;  Location: [facility];  Service: Urology;  Laterality: N/A;   ROTATOR CUFF REPAIR     TONSILLECTOMY     TOTAL HIP ARTHROPLASTY Bilateral    TRANSRECTAL ULTRASOUND N/A 12/20/2022   Procedure: TRANSRECTAL ULTRASOUND;  Surgeon: Twylla Glendia BROCKS, MD;  Location: [facility];  Service: Urology;  Laterality: N/A;    Home Medications:  Allergies as of 03/22/2024       Reactions   Rosuvastatin    Muscle Pain   Sulfa Antibiotics Itching        Medication List        Accurate as of March 22, 2024 11:59 PM. If you have any questions, ask your nurse or doctor.          acetaminophen  500 MG tablet Commonly known as: TYLENOL  Take 650 mg by mouth 3 (three) times daily.   busPIRone 15 MG tablet Commonly known as:  BUSPAR Take 15 mg by mouth 2 (two) times daily.   cholecalciferol 25 MCG (1000 UNIT) tablet Commonly known as: VITAMIN D3 Take 1,000 Units by mouth daily.   cilostazol 100 MG tablet Commonly known as: PLETAL Take 100 mg by mouth 2 (two) times daily.   clopidogrel 75 MG tablet Commonly known as: PLAVIX Take 1 tablet by mouth daily.   finasteride  5 MG tablet Commonly known as: PROSCAR  TAKE (1) TABLET BY MOUTH EVERY DAY What changed: See the new instructions.   fluticasone  50 MCG/ACT nasal spray Commonly known as: FLONASE  Place 1 spray into both nostrils daily.   gabapentin 100 MG capsule Commonly known as: NEURONTIN Take 500 mg by mouth at bedtime.   glimepiride 4 MG tablet Commonly known as: AMARYL Take 4 mg by mouth 2 (two) times daily.   glucose blood test strip USE 3 TIMES DAILY   hydrochlorothiazide 25 MG tablet Commonly  known as: HYDRODIURIL Take by mouth.   hydrocortisone  2.5 % rectal cream Commonly known as: Anusol -HC Place 1 Application rectally 2 (two) times daily.   LYSINE PO Take 3 capsules by mouth daily as needed (immune support).   metFORMIN 500 MG tablet Commonly known as: GLUCOPHAGE Take 500 mg by mouth 2 (two) times daily.   metoprolol succinate 25 MG 24 hr tablet Commonly known as: TOPROL-XL Take 12.5 mg by mouth daily.   Mounjaro 15 MG/0.5ML Pen Generic drug: tirzepatide SMARTSIG:15 Milligram(s) SUB-Q Once a Week   pantoprazole 40 MG tablet Commonly known as: PROTONIX Take 40 mg by mouth daily.   potassium chloride 10 MEQ tablet Commonly known as: KLOR-CON Take by mouth.   PROSTATE HEALTH PO Take 1 tablet by mouth in the morning and at bedtime.   tadalafil  20 MG tablet Commonly known as: CIALIS  TAKE ONE TABLET BY MOUTH AS NEEDED 1 HOUR PRIOR TO INTERCOURSE   tamsulosin 0.4 MG Caps capsule Commonly known as: FLOMAX Take 0.4 mg by mouth 2 (two) times daily.   Trulicity 3 MG/0.5ML Soaj Generic drug: Dulaglutide Inject 3 mg  into the skin every 7 (seven) days.        Allergies:  Allergies  Allergen Reactions   Rosuvastatin     Muscle Pain   Sulfa Antibiotics Itching    Family History: No family history on file.  Social History:  reports that he has never smoked. He has never used smokeless tobacco. He reports that he does not currently use alcohol. He reports that he does not use drugs.   Physical Exam: BP 133/73   Pulse 61   Ht 5' 8 (1.727 m)   Wt 197 lb (89.4 kg)   BMI 29.95 kg/m   Constitutional:  Alert and oriented, No acute distress. HEENT: [place] AT Respiratory: Normal respiratory effort, no increased work of breathing. Psychiatric: Normal mood and affect.   Assessment & Plan:    1.  Prostate cancer PSA remains undetectable  2.  Lower urinary tract symptoms Primarily bothersome storage related voiding symptoms Plan decrease tamsulosin to 0.4 mg as this most likely is not secondary to prostatic obstruction and more related to bladder overactivity He still has Myrbetriq  samples which he never took and will start and call back regarding efficacy in ~1 month.  If no significant improvement will give a trial of Gemtesa PVR today was 14 mL  3.  Lower extremity symptoms Assessment by neurology are symptoms related to spinal stenosis and polyneuropathy We discussed a contributing factor could be his hypogonadism which can sometimes be responsible for vague symptoms Discussed his total testosterone  level is severely low.  We also discussed AUA guidelines do not recommend free testosterone  level determinations due to widely varying results.  The fact that his total testosterone  level is less than 3 makes a free testosterone  level determination invalid   Glendia JAYSON Barba, MD  [facility] [address], [address] [place], [place] [address] [phone]

## 2024-03-24 ENCOUNTER — Encounter: Payer: Self-pay | Admitting: Urology

## 2024-03-27 ENCOUNTER — Ambulatory Visit: Admitting: Urology

## 2024-08-22 ENCOUNTER — Inpatient Hospital Stay: Attending: Radiation Oncology

## 2024-08-22 DIAGNOSIS — C61 Malignant neoplasm of prostate: Secondary | ICD-10-CM | POA: Insufficient documentation

## 2024-08-22 LAB — PSA: Prostatic Specific Antigen: 0.05 ng/mL (ref 0.00–4.00)

## 2024-08-29 ENCOUNTER — Inpatient Hospital Stay
Admission: RE | Admit: 2024-08-29 | Discharge: 2024-08-29 | Attending: Radiation Oncology | Admitting: Radiation Oncology

## 2024-08-29 ENCOUNTER — Other Ambulatory Visit: Payer: Self-pay | Admitting: *Deleted

## 2024-08-29 VITALS — BP 131/56 | HR 60 | Temp 98.2°F | Resp 16 | Wt 196.0 lb

## 2024-08-29 DIAGNOSIS — Z923 Personal history of irradiation: Secondary | ICD-10-CM | POA: Diagnosis not present

## 2024-08-29 DIAGNOSIS — C61 Malignant neoplasm of prostate: Secondary | ICD-10-CM | POA: Insufficient documentation

## 2024-08-29 DIAGNOSIS — R351 Nocturia: Secondary | ICD-10-CM | POA: Insufficient documentation

## 2024-08-29 NOTE — Progress Notes (Signed)
 Radiation Oncology Follow up Note  Name: Jake Hudson   Date:   08/29/2024 MRN:  [id number] DOB: 1941/07/11    This 83 y.o. male presents to the clinic today for 73-month follow-up status post IMRT radiation therapy to his prostate for Gleason 7 (4+3) adenocarcinoma presenting with a PSA of 5.7  REFERRING PROVIDER: Jeffie Cheryl BRAVO, MD  HPI: Patient is an 83 year old male now out 16 months having completed image guided IMRT radiation therapy for Gleason 7 (4+3) adenocarcinoma the prostate presenting with a PSA of 5.7.  Seen today in routine follow-up he is doing fairly well.SABRA  He does continue to have some nocturia x 2-3 which is average for his age he is on Flomax as well as taking natural products for prostate health.  He is having no specific bowel problems.  His most recent PSA is 0.05 stable over time was 0.01 a year ago.  COMPLICATIONS OF TREATMENT: none  FOLLOW UP COMPLIANCE: keeps appointments   PHYSICAL EXAM:  BP (!) 131/56   Pulse 60   Temp 98.2 F (36.8 C) (Tympanic)   Resp 16   Wt 196 lb (88.9 kg)   BMI 29.80 kg/m  Well-developed well-nourished patient in NAD. HEENT reveals PERLA, EOMI, discs not visualized.  Oral cavity is clear. No oral mucosal lesions are identified. Neck is clear without evidence of cervical or supraclavicular adenopathy. Lungs are clear to A&P. Cardiac examination is essentially unremarkable with regular rate and rhythm without murmur rub or thrill. Abdomen is benign with no organomegaly or masses noted. Motor sensory and DTR levels are equal and symmetric in the upper and lower extremities. Cranial nerves II through XII are grossly intact. Proprioception is intact. No peripheral adenopathy or edema is identified. No motor or sensory levels are noted. Crude visual fields are within normal range.  RADIOLOGY RESULTS: No current films for review  PLAN: Present time patient is doing well under excellent biochemical control of his prostate  cancer.  I am pleased with his overall progress.  I have asked to see him back in 1 year for follow-up with a repeat PSA.  Patient knows to call with any concerns.  I would like to take this opportunity to thank you for allowing me to participate in the care of your patient.SABRA Jake Penton, MD

## 2025-08-21 ENCOUNTER — Inpatient Hospital Stay

## 2025-08-28 ENCOUNTER — Ambulatory Visit: Admitting: Radiation Oncology
